# Patient Record
Sex: Female | Born: 1955 | Race: White | Hispanic: No | Marital: Married | Smoking: Never smoker
Health system: Southern US, Community
[De-identification: ages and names within clinical notes are randomized; demographics above are authoritative.]

## PROBLEM LIST (undated history)

## (undated) DIAGNOSIS — E288 Other ovarian dysfunction: Secondary | ICD-10-CM

## (undated) DIAGNOSIS — M810 Age-related osteoporosis without current pathological fracture: Secondary | ICD-10-CM

## (undated) DIAGNOSIS — D6851 Activated protein C resistance: Secondary | ICD-10-CM

## (undated) DIAGNOSIS — R112 Nausea with vomiting, unspecified: Secondary | ICD-10-CM

## (undated) DIAGNOSIS — E042 Nontoxic multinodular goiter: Secondary | ICD-10-CM

## (undated) DIAGNOSIS — Z9889 Other specified postprocedural states: Secondary | ICD-10-CM

## (undated) DIAGNOSIS — M8580 Other specified disorders of bone density and structure, unspecified site: Secondary | ICD-10-CM

## (undated) DIAGNOSIS — T7840XA Allergy, unspecified, initial encounter: Secondary | ICD-10-CM

## (undated) DIAGNOSIS — D6859 Other primary thrombophilia: Secondary | ICD-10-CM

## (undated) DIAGNOSIS — R87619 Unspecified abnormal cytological findings in specimens from cervix uteri: Secondary | ICD-10-CM

## (undated) DIAGNOSIS — D689 Coagulation defect, unspecified: Secondary | ICD-10-CM

## (undated) DIAGNOSIS — E079 Disorder of thyroid, unspecified: Secondary | ICD-10-CM

## (undated) HISTORY — PX: TONSILECTOMY, ADENOIDECTOMY, BILATERAL MYRINGOTOMY AND TUBES: SHX2538

## (undated) HISTORY — DX: Coagulation defect, unspecified: D68.9

## (undated) HISTORY — DX: Unspecified abnormal cytological findings in specimens from cervix uteri: R87.619

## (undated) HISTORY — DX: Other specified disorders of bone density and structure, unspecified site: M85.80

## (undated) HISTORY — DX: Nontoxic multinodular goiter: E04.2

## (undated) HISTORY — DX: Age-related osteoporosis without current pathological fracture: M81.0

## (undated) HISTORY — DX: Other ovarian dysfunction: E28.8

## (undated) HISTORY — PX: BREAST CYST ASPIRATION: SHX578

## (undated) HISTORY — DX: Allergy, unspecified, initial encounter: T78.40XA

## (undated) HISTORY — DX: Activated protein C resistance: D68.51

## (undated) HISTORY — DX: Disorder of thyroid, unspecified: E07.9

## (undated) HISTORY — DX: Other primary thrombophilia: D68.59

---

## 1998-05-26 ENCOUNTER — Ambulatory Visit (HOSPITAL_COMMUNITY): Admission: RE | Admit: 1998-05-26 | Discharge: 1998-05-26 | Payer: Self-pay | Admitting: Gynecology

## 1999-05-13 ENCOUNTER — Inpatient Hospital Stay (HOSPITAL_COMMUNITY): Admission: AD | Admit: 1999-05-13 | Discharge: 1999-05-15 | Payer: Self-pay | Admitting: Obstetrics & Gynecology

## 1999-06-18 ENCOUNTER — Other Ambulatory Visit: Admission: RE | Admit: 1999-06-18 | Discharge: 1999-06-18 | Payer: Self-pay | Admitting: Obstetrics and Gynecology

## 1999-12-28 ENCOUNTER — Encounter: Payer: Self-pay | Admitting: Gynecology

## 1999-12-28 ENCOUNTER — Encounter: Admission: RE | Admit: 1999-12-28 | Discharge: 1999-12-28 | Payer: Self-pay | Admitting: Gynecology

## 2007-12-12 DIAGNOSIS — M8580 Other specified disorders of bone density and structure, unspecified site: Secondary | ICD-10-CM

## 2007-12-12 HISTORY — DX: Other specified disorders of bone density and structure, unspecified site: M85.80

## 2010-11-27 ENCOUNTER — Other Ambulatory Visit: Payer: Self-pay | Admitting: Nurse Practitioner

## 2010-11-27 DIAGNOSIS — Z1231 Encounter for screening mammogram for malignant neoplasm of breast: Secondary | ICD-10-CM

## 2010-12-05 ENCOUNTER — Ambulatory Visit (HOSPITAL_COMMUNITY)
Admission: RE | Admit: 2010-12-05 | Discharge: 2010-12-05 | Disposition: A | Discharge status: Home / Self Care | Payer: BC Managed Care – PPO | Source: Ambulatory Visit | Attending: Nurse Practitioner | Admitting: Nurse Practitioner

## 2010-12-05 DIAGNOSIS — Z1231 Encounter for screening mammogram for malignant neoplasm of breast: Secondary | ICD-10-CM

## 2010-12-07 ENCOUNTER — Other Ambulatory Visit: Payer: Self-pay | Admitting: Nurse Practitioner

## 2010-12-07 ENCOUNTER — Other Ambulatory Visit: Payer: Self-pay | Admitting: Obstetrics and Gynecology

## 2010-12-07 DIAGNOSIS — R928 Other abnormal and inconclusive findings on diagnostic imaging of breast: Secondary | ICD-10-CM

## 2010-12-17 ENCOUNTER — Ambulatory Visit
Admission: RE | Admit: 2010-12-17 | Discharge: 2010-12-17 | Disposition: A | Discharge status: Home / Self Care | Payer: BC Managed Care – PPO | Source: Ambulatory Visit | Attending: Obstetrics and Gynecology | Admitting: Obstetrics and Gynecology

## 2010-12-17 DIAGNOSIS — R928 Other abnormal and inconclusive findings on diagnostic imaging of breast: Secondary | ICD-10-CM

## 2011-12-02 ENCOUNTER — Other Ambulatory Visit: Payer: Self-pay | Admitting: Obstetrics & Gynecology

## 2011-12-02 DIAGNOSIS — Z1231 Encounter for screening mammogram for malignant neoplasm of breast: Secondary | ICD-10-CM

## 2011-12-05 LAB — HM PAP SMEAR: HM Pap smear: NEGATIVE

## 2011-12-18 ENCOUNTER — Ambulatory Visit (HOSPITAL_COMMUNITY)
Admission: RE | Admit: 2011-12-18 | Discharge: 2011-12-18 | Disposition: A | Discharge status: Home / Self Care | Payer: BC Managed Care – PPO | Source: Ambulatory Visit | Attending: Obstetrics & Gynecology | Admitting: Obstetrics & Gynecology

## 2011-12-18 DIAGNOSIS — Z1231 Encounter for screening mammogram for malignant neoplasm of breast: Secondary | ICD-10-CM | POA: Insufficient documentation

## 2011-12-23 ENCOUNTER — Other Ambulatory Visit: Payer: Self-pay | Admitting: Obstetrics & Gynecology

## 2011-12-23 DIAGNOSIS — R928 Other abnormal and inconclusive findings on diagnostic imaging of breast: Secondary | ICD-10-CM

## 2011-12-30 ENCOUNTER — Ambulatory Visit
Admission: RE | Admit: 2011-12-30 | Discharge: 2011-12-30 | Disposition: A | Discharge status: Home / Self Care | Payer: BC Managed Care – PPO | Source: Ambulatory Visit | Attending: Obstetrics & Gynecology | Admitting: Obstetrics & Gynecology

## 2011-12-30 DIAGNOSIS — R928 Other abnormal and inconclusive findings on diagnostic imaging of breast: Secondary | ICD-10-CM

## 2012-10-11 HISTORY — PX: SKIN CANCER EXCISION: SHX779

## 2012-12-04 ENCOUNTER — Encounter: Payer: Self-pay | Admitting: *Deleted

## 2012-12-07 ENCOUNTER — Other Ambulatory Visit: Payer: Self-pay | Admitting: Nurse Practitioner

## 2012-12-07 DIAGNOSIS — Z1231 Encounter for screening mammogram for malignant neoplasm of breast: Secondary | ICD-10-CM

## 2012-12-08 ENCOUNTER — Ambulatory Visit (INDEPENDENT_AMBULATORY_CARE_PROVIDER_SITE_OTHER): Payer: BC Managed Care – PPO | Admitting: Nurse Practitioner

## 2012-12-08 ENCOUNTER — Encounter: Payer: Self-pay | Admitting: Nurse Practitioner

## 2012-12-08 VITALS — BP 118/66 | HR 72 | Resp 12 | Ht 68.0 in | Wt 138.8 lb

## 2012-12-08 DIAGNOSIS — Z0000 Encounter for general adult medical examination without abnormal findings: Secondary | ICD-10-CM

## 2012-12-08 DIAGNOSIS — Z01419 Encounter for gynecological examination (general) (routine) without abnormal findings: Secondary | ICD-10-CM

## 2012-12-08 DIAGNOSIS — E039 Hypothyroidism, unspecified: Secondary | ICD-10-CM

## 2012-12-08 DIAGNOSIS — M8580 Other specified disorders of bone density and structure, unspecified site: Secondary | ICD-10-CM

## 2012-12-08 DIAGNOSIS — M899 Disorder of bone, unspecified: Secondary | ICD-10-CM

## 2012-12-08 LAB — HEMOGLOBIN, FINGERSTICK: Hemoglobin, fingerstick: 14 g/dL (ref 12.0–16.0)

## 2012-12-08 LAB — POCT URINALYSIS DIPSTICK
Spec Grav, UA: 1.015
Urobilinogen, UA: NEGATIVE

## 2012-12-08 LAB — TSH: TSH: 0.542 u[IU]/mL (ref 0.350–4.500)

## 2012-12-08 MED ORDER — LEVOTHYROXINE SODIUM 75 MCG PO TABS: 75.0000 ug | ORAL_TABLET | Freq: Every day | ORAL | Status: DC

## 2012-12-08 NOTE — Patient Instructions (Addendum)

## 2012-12-08 NOTE — Progress Notes (Signed)
57 y.o. [id number] Married Caucasian Fe here for annual exam.  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 Still some vaso symptoms.  No vaginal dryness.  No LMP recorded. Patient is postmenopausal.          Sexually active: yes  The current method of family planning is post menopausal status.    Exercising: yes  run/walk Smoker:  no  Health Maintenance: Pap:  12/05/2011  Normal with negative HR HPV MMG:  12/30/2011 breast cyst repeat diagnostic ws normal Colonoscopy:  2010 normal repeat in 10 years BMD:   12/2007  -0.5/-1.8/-1.5 TDaP:  11/30/2010 Labs: Hgb-    reports that she has never smoked. She has never used smokeless tobacco. She reports that  drinks alcohol. She reports that she does not use illicit drugs.  Past Medical History  Diagnosis Date  . Factor V Leiden   . Thyroid disease     hypothyroid  . Multiple thyroid nodules   . Osteoporosis   . Protein S deficiency     Past Surgical History  Procedure Laterality Date  . Tonsilectomy, adenoidectomy, bilateral myringotomy and tubes      at age 55   . Vaginal delivery      x2    Current Outpatient Prescriptions  Medication Sig Dispense Refill  . cholecalciferol (VITAMIN D) 1000 UNITS tablet Take 1,000 Units by mouth daily.      Marland Kitchen glucosamine-chondroitin 500-400 MG tablet Take 1 tablet by mouth 3 (three) times daily.      Marland Kitchen levothyroxine (SYNTHROID, LEVOTHROID) 75 MCG tablet Take 75 mcg by mouth daily before breakfast.      . Multiple Vitamin (MULTIVITAMIN) tablet Take 1 tablet by mouth daily.       No current facility-administered medications for this visit.    History reviewed. No pertinent family history.  ROS:  Pertinent items are noted in HPI.  Otherwise, a comprehensive ROS was negative.  Exam:   BP 118/66  Pulse 72  Resp 12  Ht 5\' 8"  (1.727 m)  Wt 138 lb 12.8 oz (62.959 kg)  BMI 21.11 kg/m2 Height: 5\' 8"  (172.7 cm)  Ht Readings from Last 3 Encounters:  12/08/12 5\' 8"  (1.727 m)     General appearance: alert, cooperative and appears stated age Head: Normocephalic, without obvious abnormality, atraumatic Neck: no adenopathy, supple, symmetrical, trachea midline and thyroid without asymmetry  normal to inspection and palpation Lungs: clear to auscultation bilaterally Breasts: normal appearance, no masses or tenderness Heart: regular rate and rhythm Abdomen: soft, non-tender; no masses,  no organomegaly Extremities: extremities normal, atraumatic, no cyanosis or edema Skin: Skin color, texture, turgor normal. No rashes or lesions Lymph nodes: Cervical, supraclavicular, and axillary nodes normal. No abnormal inguinal nodes palpated Neurologic: Grossly normal   Pelvic: External genitalia:  no lesions              Urethra:  normal appearing urethra with no masses, tenderness or lesions              Bartholin's and Skene's: normal                 Vagina: normal appearing vagina with normal color and discharge, no lesions              Cervix: anteverted              Pap taken: no Bimanual Exam:  Uterus:  normal size, contour, position, consistency, mobility, non-tender  Adnexa: no mass, fullness, tenderness               Rectovaginal: Confirms               Anus:  normal sphincter tone, no lesions  A:  Well Woman with normal exam  Postmenopausal  Hypothyroid with thyroid nodules  history factor V Leiden  Osteopenia  P:   Pap smear as per guidelines   Mammogram due 12/2012 recommend 3D  [facility]  Counseled on diet exercise, SBE return annually or prn  An After Visit Summary was printed and given to the patient.

## 2012-12-09 NOTE — Progress Notes (Signed)
Encounter reviewed by Dr. Emmali Karow Silva.  

## 2012-12-10 ENCOUNTER — Telehealth: Payer: Self-pay | Admitting: *Deleted

## 2012-12-10 NOTE — Telephone Encounter (Signed)
Message copied by Osie Bond on Thu Dec 10, 2012  9:25 AM ------      Message from: Ria Comment R      Created: Tue Dec 08, 2012  6:50 PM       Let patient know lab results. Same dose of synthroid ------

## 2012-12-10 NOTE — Telephone Encounter (Signed)
Pt is aware of TSH lab results and is agreeable to stay on same dosage of Synthroid.

## 2013-01-05 ENCOUNTER — Other Ambulatory Visit: Payer: Self-pay | Admitting: Nurse Practitioner

## 2013-01-05 ENCOUNTER — Ambulatory Visit (HOSPITAL_COMMUNITY)
Admission: RE | Admit: 2013-01-05 | Discharge: 2013-01-05 | Disposition: A | Discharge status: Home / Self Care | Payer: BC Managed Care – PPO | Source: Ambulatory Visit | Attending: Nurse Practitioner | Admitting: Nurse Practitioner

## 2013-01-05 DIAGNOSIS — M899 Disorder of bone, unspecified: Secondary | ICD-10-CM

## 2013-01-05 DIAGNOSIS — Z1231 Encounter for screening mammogram for malignant neoplasm of breast: Secondary | ICD-10-CM | POA: Insufficient documentation

## 2013-01-27 ENCOUNTER — Ambulatory Visit (HOSPITAL_COMMUNITY)
Admission: RE | Admit: 2013-01-27 | Discharge: 2013-01-27 | Disposition: A | Discharge status: Home / Self Care | Payer: BC Managed Care – PPO | Source: Ambulatory Visit | Attending: Nurse Practitioner | Admitting: Nurse Practitioner

## 2013-01-27 DIAGNOSIS — Z780 Asymptomatic menopausal state: Secondary | ICD-10-CM | POA: Insufficient documentation

## 2013-01-27 DIAGNOSIS — Z13820 Encounter for screening for osteoporosis: Secondary | ICD-10-CM | POA: Insufficient documentation

## 2013-01-27 DIAGNOSIS — M8580 Other specified disorders of bone density and structure, unspecified site: Secondary | ICD-10-CM

## 2013-10-11 HISTORY — PX: REFRACTIVE SURGERY: SHX103

## 2013-12-10 ENCOUNTER — Ambulatory Visit (INDEPENDENT_AMBULATORY_CARE_PROVIDER_SITE_OTHER): Payer: BC Managed Care – PPO | Admitting: Nurse Practitioner

## 2013-12-10 ENCOUNTER — Encounter: Payer: Self-pay | Admitting: Nurse Practitioner

## 2013-12-10 VITALS — BP 110/70 | HR 80 | Ht 66.0 in | Wt 136.0 lb

## 2013-12-10 DIAGNOSIS — E039 Hypothyroidism, unspecified: Secondary | ICD-10-CM

## 2013-12-10 DIAGNOSIS — D6859 Other primary thrombophilia: Secondary | ICD-10-CM

## 2013-12-10 DIAGNOSIS — Z1211 Encounter for screening for malignant neoplasm of colon: Secondary | ICD-10-CM

## 2013-12-10 DIAGNOSIS — D6851 Activated protein C resistance: Secondary | ICD-10-CM

## 2013-12-10 DIAGNOSIS — Z0000 Encounter for general adult medical examination without abnormal findings: Secondary | ICD-10-CM

## 2013-12-10 DIAGNOSIS — Z01419 Encounter for gynecological examination (general) (routine) without abnormal findings: Secondary | ICD-10-CM

## 2013-12-10 LAB — COMPREHENSIVE METABOLIC PANEL
ALBUMIN: 4.6 g/dL (ref 3.5–5.2)
ALT: 14 U/L (ref 0–35)
AST: 16 U/L (ref 0–37)
Alkaline Phosphatase: 66 U/L (ref 39–117)
BUN: 17 mg/dL (ref 6–23)
CALCIUM: 9.9 mg/dL (ref 8.4–10.5)
CHLORIDE: 103 meq/L (ref 96–112)
CO2: 31 meq/L (ref 19–32)
CREATININE: 0.69 mg/dL (ref 0.50–1.10)
GLUCOSE: 84 mg/dL (ref 70–99)
POTASSIUM: 4.7 meq/L (ref 3.5–5.3)
Sodium: 140 mEq/L (ref 135–145)
Total Bilirubin: 0.4 mg/dL (ref 0.2–1.2)
Total Protein: 7.2 g/dL (ref 6.0–8.3)

## 2013-12-10 LAB — HEMOGLOBIN, FINGERSTICK: HEMOGLOBIN, FINGERSTICK: 14 g/dL (ref 12.0–16.0)

## 2013-12-10 LAB — CBC WITH DIFFERENTIAL/PLATELET
BASOS ABS: 0 10*3/uL (ref 0.0–0.1)
BASOS PCT: 0 % (ref 0–1)
Eosinophils Absolute: 0.2 10*3/uL (ref 0.0–0.7)
Eosinophils Relative: 3 % (ref 0–5)
HEMATOCRIT: 41.7 % (ref 36.0–46.0)
HEMOGLOBIN: 14.2 g/dL (ref 12.0–15.0)
LYMPHS PCT: 43 % (ref 12–46)
Lymphs Abs: 2.4 10*3/uL (ref 0.7–4.0)
MCH: 28.3 pg (ref 26.0–34.0)
MCHC: 34.1 g/dL (ref 30.0–36.0)
MCV: 83.1 fL (ref 78.0–100.0)
MONOS PCT: 7 % (ref 3–12)
Monocytes Absolute: 0.4 10*3/uL (ref 0.1–1.0)
NEUTROS ABS: 2.6 10*3/uL (ref 1.7–7.7)
NEUTROS PCT: 47 % (ref 43–77)
Platelets: 221 10*3/uL (ref 150–400)
RBC: 5.02 MIL/uL (ref 3.87–5.11)
RDW: 14.4 % (ref 11.5–15.5)
WBC: 5.5 10*3/uL (ref 4.0–10.5)

## 2013-12-10 LAB — LIPID PANEL
CHOL/HDL RATIO: 2 ratio
CHOLESTEROL: 157 mg/dL (ref 0–200)
HDL: 79 mg/dL (ref >39)
LDL Cholesterol: 67 mg/dL (ref 0–99)
TRIGLYCERIDES: 55 mg/dL (ref <150)
VLDL: 11 mg/dL (ref 0–40)

## 2013-12-10 LAB — POCT URINALYSIS DIPSTICK
BILIRUBIN UA: NEGATIVE
Blood, UA: NEGATIVE
GLUCOSE UA: NEGATIVE
KETONES UA: NEGATIVE
LEUKOCYTES UA: NEGATIVE
NITRITE UA: NEGATIVE
PH UA: 5
Protein, UA: NEGATIVE
Urobilinogen, UA: NEGATIVE

## 2013-12-10 NOTE — Progress Notes (Signed)
Patient ID: Brandy Ortiz, female   DOB: 06/18/1955, 58 y.o.   MRN: [id number] 58 y.o. [id number] Married Caucasian Fe here for annual exam.  No new health problems.  Some vaginal dryness and using OTC lubrication.  Patient's last menstrual period was 10/12/2003.          Sexually active: yes  The current method of family planning is post menopausal status.  Exercising: yes run/walk  Smoker: no   Health Maintenance:  Pap: 12/05/2011 Normal with negative HR HPV  MMG: 01/05/13, Bi-Rads 1: negative  Colonoscopy: 2010 normal repeat in 10 years  BMD:  01/27/13,  12/2007 -0.5/-1.8/-1.5  TDaP: 11/30/2010  Labs:  HB:  14.0   Urine:  Negative    reports that she has never smoked. She has never used smokeless tobacco. She reports that she drinks alcohol. She reports that she does not use illicit drugs.  Past Medical History  Diagnosis Date  . Factor V Leiden   . Thyroid disease     hypothyroid  . Multiple thyroid nodules   . Osteoporosis   . Protein S deficiency   . Osteopenia 12/2007  . 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 Tonsilectomy, adenoidectomy, bilateral myringotomy and tubes      at age 89   . Vaginal delivery      x2  . 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 Refractive surgery Right 10/2013    Lasix    Current Outpatient Prescriptions  Medication Sig Dispense Refill  . cholecalciferol (VITAMIN D) 1000 UNITS tablet Take 1,000 Units by mouth daily.      Marland Kitchen levothyroxine (SYNTHROID, LEVOTHROID) 75 MCG tablet Take 1 tablet (75 mcg total) by mouth daily before breakfast.  90 tablet  3  . Multiple Vitamin (MULTIVITAMIN) tablet Take 1 tablet by mouth daily.      Marland Kitchen glucosamine-chondroitin 500-400 MG tablet Take 1 tablet by mouth 3 (three) times daily.       No current facility-administered medications for this visit.    Family History  Problem Relation Age of Onset  . Adopted: Yes  . Heart disease  Father   . Stroke Father     ROS:  Pertinent items are noted in HPI.  Otherwise, a comprehensive ROS was negative.  Exam:   BP 110/70  Pulse 80  Ht 5\' 6"  (1.676 m)  Wt 136 lb (61.689 kg)  BMI 21.96 kg/m2  LMP 10/12/2003 Height: 5\' 6"  (167.6 cm)  Ht Readings from Last 3 Encounters:  12/10/13 5\' 6"  (1.676 m)  12/08/12 5\' 8"  (1.727 m)    General appearance: alert, cooperative and appears stated age Head: Normocephalic, without obvious abnormality, atraumatic Neck: no adenopathy, supple, symmetrical, trachea midline and thyroid with small nodules that are stable to inspection and palpation Lungs: clear to auscultation bilaterally Breasts: normal appearance, no masses or tenderness, FCB changes Heart: regular rate and rhythm Abdomen: soft, non-tender; no masses,  no organomegaly Extremities: extremities normal, atraumatic, no cyanosis or edema Skin: Skin color, texture, turgor normal. No rashes or lesions Lymph nodes: Cervical, supraclavicular, and axillary nodes normal. No abnormal inguinal nodes palpated Neurologic: Grossly normal   Pelvic: External genitalia:  no lesions              Urethra:  normal appearing urethra with no masses, tenderness or lesions              Bartholin's and  Skene's: normal                 Vagina: normal appearing vagina with normal color and discharge, no lesions              Cervix: anteverted              Pap taken: No. Bimanual Exam:  Uterus:  normal size, contour, position, consistency, mobility, non-tender              Adnexa: no mass, fullness, tenderness               Rectovaginal: Confirms               Anus:  normal sphincter tone, no lesions  A:  Well Woman with normal exam  History of POF  History of hypothyroid with nodules on replacement  History of Factor V Leiden,  Protein S deficiency    P:   Reviewed health and wellness pertinent to exam  Pap smear not taken today  Mammogram is due now and will schedule 3D  Refill Synthroid  and will follow with labs  IFOB is given  Counseled on breast self exam, mammography screening, adequate intake of calcium and vitamin D, diet and exercise, Kegel's exercises return annually or prn  An After Visit Summary was printed and given to the patient.

## 2013-12-10 NOTE — Patient Instructions (Signed)

## 2013-12-11 LAB — TSH: TSH: 0.811 u[IU]/mL (ref 0.350–4.500)

## 2013-12-11 LAB — VITAMIN D 25 HYDROXY (VIT D DEFICIENCY, FRACTURES): Vit D, 25-Hydroxy: 67 ng/mL (ref 30–89)

## 2013-12-19 NOTE — Progress Notes (Signed)
Encounter reviewed by Dr. Forrest Demuro Silva.  

## 2013-12-23 ENCOUNTER — Other Ambulatory Visit: Payer: Self-pay | Admitting: Nurse Practitioner

## 2013-12-23 DIAGNOSIS — Z1231 Encounter for screening mammogram for malignant neoplasm of breast: Secondary | ICD-10-CM

## 2013-12-24 LAB — FECAL OCCULT BLOOD, IMMUNOCHEMICAL: IMMUNOLOGICAL FECAL OCCULT BLOOD TEST: NEGATIVE

## 2013-12-24 NOTE — Addendum Note (Signed)
Addended by: Graylon Good on: 12/24/2013 09:49 AM   Modules accepted: Orders

## 2014-01-06 ENCOUNTER — Ambulatory Visit (HOSPITAL_COMMUNITY)
Admission: RE | Admit: 2014-01-06 | Discharge: 2014-01-06 | Disposition: A | Discharge status: Home / Self Care | Payer: BC Managed Care – PPO | Source: Ambulatory Visit | Attending: Nurse Practitioner | Admitting: Nurse Practitioner

## 2014-01-06 DIAGNOSIS — Z1231 Encounter for screening mammogram for malignant neoplasm of breast: Secondary | ICD-10-CM | POA: Diagnosis not present

## 2014-01-16 ENCOUNTER — Other Ambulatory Visit: Payer: Self-pay | Admitting: Nurse Practitioner

## 2014-01-18 NOTE — Telephone Encounter (Signed)
Last AEX: 12/10/13 Last refill:12/08/12 #90 X 3 Current AEX: 12/12/14 Last TSH: 0.811  Please advise

## 2014-03-14 ENCOUNTER — Encounter: Payer: Self-pay | Admitting: Nurse Practitioner

## 2014-12-12 ENCOUNTER — Telehealth: Payer: Self-pay | Admitting: Nurse Practitioner

## 2014-12-12 ENCOUNTER — Ambulatory Visit (INDEPENDENT_AMBULATORY_CARE_PROVIDER_SITE_OTHER): Payer: BC Managed Care – PPO | Admitting: Nurse Practitioner

## 2014-12-12 ENCOUNTER — Encounter: Payer: Self-pay | Admitting: Nurse Practitioner

## 2014-12-12 VITALS — BP 114/70 | HR 68 | Ht 65.0 in | Wt 136.0 lb

## 2014-12-12 DIAGNOSIS — Z0000 Encounter for general adult medical examination without abnormal findings: Secondary | ICD-10-CM

## 2014-12-12 DIAGNOSIS — Z01419 Encounter for gynecological examination (general) (routine) without abnormal findings: Secondary | ICD-10-CM

## 2014-12-12 DIAGNOSIS — Z1211 Encounter for screening for malignant neoplasm of colon: Secondary | ICD-10-CM | POA: Diagnosis not present

## 2014-12-12 LAB — POCT URINALYSIS DIPSTICK
BILIRUBIN UA: NEGATIVE
Blood, UA: NEGATIVE
GLUCOSE UA: NEGATIVE
KETONES UA: NEGATIVE
Leukocytes, UA: NEGATIVE
Nitrite, UA: NEGATIVE
Protein, UA: NEGATIVE
Urobilinogen, UA: NEGATIVE
pH, UA: 5

## 2014-12-12 MED ORDER — LEVOTHYROXINE SODIUM 75 MCG PO TABS: ORAL_TABLET | ORAL | Status: DC

## 2014-12-12 NOTE — Patient Instructions (Signed)

## 2014-12-12 NOTE — Progress Notes (Signed)
Patient ID: Brandy Ortiz, female   DOB: 05-Jan-1956, 59 y.o.   MRN: [id number] 59 y.o. [id number] Married  Caucasian Fe here for annual exam.  Doing well.  Some vaginal dryness.  Mother age 92 will be moving to [facility] in August.  daughter is going to college at [employer] - first year. Last thyroid ultrasound in [place] 4+ years ago.  Patient's last menstrual period was 10/12/2003 (approximate).          Sexually active: Yes.    The current method of family planning is post menopausal status.    Exercising: Yes.    walking and stationary bike Smoker:  no  Health Maintenance: Pap: 12/05/2011 Normal with negative HR HPV  MMG: 01/06/14, 3D, Bi-Rads 1: Negative  Colonoscopy: 2010 normal repeat in 10 years  BMD: 01/27/13, T Score -0.2 S/-1.3 R Total/-1.3 L Total TDaP: 11/30/2010  Labs:  HB:  14.1    Urine:  neg    reports that she has never smoked. She has never used smokeless tobacco. She reports that she drinks alcohol. She reports that she does not use illicit drugs.  Past Medical History  Diagnosis Date  . Factor V Leiden   . Thyroid disease     hypothyroid  . Multiple thyroid nodules   . Osteoporosis   . Protein S deficiency   . Osteopenia 12/2007  . 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 Tonsilectomy, adenoidectomy, bilateral myringotomy and tubes      at age 49   . Vaginal delivery      x2  . 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 Refractive surgery Right 10/2013    Lasix    Current Outpatient Prescriptions  Medication Sig Dispense Refill  . cholecalciferol (VITAMIN D) 1000 UNITS tablet Take 1,000 Units by mouth daily.    Marland Kitchen glucosamine-chondroitin 500-400 MG tablet Take 1 tablet by mouth 3 (three) times daily.    Marland Kitchen levothyroxine (SYNTHROID) 75 MCG tablet TAKE 1 TABLET IN THE MORNING BEFORE BREAKFAST. 90 tablet 3  . Multiple Vitamin (MULTIVITAMIN) tablet Take 1 tablet by mouth daily.      No current facility-administered medications for this visit.    Family History  Problem Relation Age of Onset  . Adopted: Yes  . Heart disease Father   . Stroke Father     ROS:  Pertinent items are noted in HPI.  Otherwise, a comprehensive ROS was negative.  Exam:   BP 114/70 mmHg  Pulse 68  Ht 5\' 5"  (1.651 m)  Wt 136 lb (61.689 kg)  BMI 22.63 kg/m2  LMP 10/12/2003 (Approximate) Height: 5\' 5"  (165.1 cm) Ht Readings from Last 3 Encounters:  12/12/14 5\' 5"  (1.651 m)  12/10/13 5\' 6"  (1.676 m)  12/08/12 5\' 8"  (1.727 m)    General appearance: alert, cooperative and appears stated age Head: Normocephalic, without obvious abnormality, atraumatic Neck: no adenopathy, supple, symmetrical, trachea midline and thyroid normal with small nodules to inspection and palpation Lungs: clear to auscultation bilaterally Breasts: normal appearance, no masses or tenderness Heart: regular rate and rhythm Abdomen: soft, non-tender; no masses,  no organomegaly Extremities: extremities normal, atraumatic, no cyanosis or edema Skin: Skin color, texture, turgor normal. No rashes or lesions Lymph nodes: Cervical, supraclavicular, and axillary nodes normal. No abnormal inguinal nodes palpated Neurologic: Grossly normal   Pelvic: External genitalia:  no lesions  Urethra:  normal appearing urethra with no masses, tenderness or lesions              Bartholin's and Skene's: normal                 Vagina: normal appearing vagina with normal color and discharge, no lesions              Cervix: anteverted              Pap taken: No. Bimanual Exam:  Uterus:  normal size, contour, position, consistency, mobility, non-tender              Adnexa: no mass, fullness, tenderness               Rectovaginal: Confirms               Anus:  normal sphincter tone, no lesions  Chaperone present:  yes  A:  Well Woman with normal exam  History of POF History of hypothyroid with nodules  on replacement History of Factor V Leiden, Protein S deficiency    P:   Reviewed health and wellness pertinent to exam  Pap smear as above  Mammogram is due 12/2014  Refill on Synthroid for a year  Will check on next thyroid US if needed  Counseled on breast self exam, mammography screening, adequate intake of calcium and vitamin D, diet and exercise return annually or prn  An After Visit Summary was printed and given to the patient.

## 2014-12-12 NOTE — Telephone Encounter (Addendum)
I called pt. To let her know that I failed to give her the IFOB kit - I have asked that she stop back by and pick this up.  She came by on Tuesday and picked up IFOB and she is charged.

## 2014-12-13 LAB — COMPREHENSIVE METABOLIC PANEL
ALBUMIN: 4.4 g/dL (ref 3.6–5.1)
ALK PHOS: 62 U/L (ref 33–130)
ALT: 15 U/L (ref 6–29)
AST: 17 U/L (ref 10–35)
BILIRUBIN TOTAL: 0.5 mg/dL (ref 0.2–1.2)
BUN: 14 mg/dL (ref 7–25)
CHLORIDE: 103 mmol/L (ref 98–110)
CO2: 29 mmol/L (ref 20–31)
Calcium: 9.8 mg/dL (ref 8.6–10.4)
Creat: 0.69 mg/dL (ref 0.50–1.05)
GLUCOSE: 87 mg/dL (ref 65–99)
POTASSIUM: 4.6 mmol/L (ref 3.5–5.3)
SODIUM: 142 mmol/L (ref 135–146)
TOTAL PROTEIN: 6.9 g/dL (ref 6.1–8.1)

## 2014-12-13 LAB — TSH: TSH: 0.834 u[IU]/mL (ref 0.350–4.500)

## 2014-12-14 LAB — IPS PAP TEST WITH HPV

## 2014-12-14 LAB — HEMOGLOBIN, FINGERSTICK: Hemoglobin, fingerstick: 14.1 g/dL (ref 12.0–16.0)

## 2014-12-15 ENCOUNTER — Telehealth: Payer: Self-pay | Admitting: Emergency Medicine

## 2014-12-15 DIAGNOSIS — E039 Hypothyroidism, unspecified: Secondary | ICD-10-CM

## 2014-12-15 DIAGNOSIS — E041 Nontoxic single thyroid nodule: Secondary | ICD-10-CM

## 2014-12-15 NOTE — Telephone Encounter (Signed)
-----   Message from Kem Boroughs, [place] sent at 12/14/2014  4:59 PM EDT ----- She had thyroid US originally done while living in  [place] - showing thyroid nodules.  I believe thyroid US / or maybe a biopsy was done there - can we get that information and then decide if she needs further eval. ----- Message -----    From: Nunzio Cobbs, MD    Sent: 12/13/2014  10:48 AM      To: Kem Boroughs, FNP  Hi Patty,   Let's get a copy of the thyroid ultrasound and any biopsies she may have done so we can understand better.  She may need to be monitored by endocrinology.   Thanks.  Brook   ----- Message -----    From: Kem Boroughs, FNP    Sent: 12/12/2014   3:25 PM      To: Brook Oletta Lamas, MD  This patient had thyroid US in [place] before moving here.  When or does she need a repeat thyroid US done again.  We monitor TSH and refill her Synthroid.

## 2014-12-15 NOTE — Telephone Encounter (Signed)
Patient given results from provider and she came by yesterday to pick up stool testing kit.   Patient states she was seen by [facility], Dr. Fredirick Lathe. Will request records. Faxed request to [phone] at this time.

## 2014-12-15 NOTE — Telephone Encounter (Signed)
Entered by Kem Boroughs, FNP at 12/13/2014 8:24 AM   Webb Silversmith, The labs show as normal kidney, liver, and glucose. The thyroid is normal. I failed to give you the stool test yesterday and called with a message. So sorry about this - better to stop by and pick up than to mail it.

## 2014-12-17 NOTE — Progress Notes (Signed)
Encounter reviewed by Dr. Aundria Rud. Will get records from prior thyroid ultrasound.

## 2014-12-21 ENCOUNTER — Encounter: Payer: Self-pay | Admitting: Emergency Medicine

## 2014-12-22 NOTE — Telephone Encounter (Signed)
Called patient and she is agreeable to scheduling thyroid ultrasound. She is scheduled for 12/26/14 at 1545 at [facility] at [address], [place] [address].  Instructions and information given to patient and she is agreeable. Advised will call back with results and plan of care.  Routing to provider for final review. Patient agreeable to disposition. Will close encounter.    Marland Kitchen

## 2014-12-22 NOTE — Telephone Encounter (Signed)
-----   Message from Kem Boroughs, [place] sent at 12/20/2014  9:58 AM EDT ----- Upon review of medical records from [facility], the last thyroid US was in 2010.  Report says to repeat in 2 years - which has not been done.  So she is due for a repeat thyroid US.  Please schedule with patient.  ----- Message -----    From: Michele Mcalpine, RN    Sent: 12/20/2014   8:47 AM      To: Kem Boroughs, FNP  Patty I have obtained the records and placed in your office.  ----- Message -----    From: Kem Boroughs, FNP    Sent: 12/14/2014   4:59 PM      To: Trellis Paganini Paidyn Mcferran, RN  She had thyroid US originally done while living in  [place] - showing thyroid nodules.  I believe thyroid US / or maybe a biopsy was done there - can we get that information and then decide if she needs further eval. ----- Message -----    From: Nunzio Cobbs, MD    Sent: 12/13/2014  10:48 AM      To: Kem Boroughs, FNP  Hi Patty,   Let's get a copy of the thyroid ultrasound and any biopsies she may have done so we can understand better.  She may need to be monitored by endocrinology.   Thanks.  Brook   ----- Message -----    From: Kem Boroughs, FNP    Sent: 12/12/2014   3:25 PM      To: Brook Oletta Lamas, MD  This patient had thyroid US in [place] before moving here.  When or does she need a repeat thyroid US done again.  We monitor TSH and refill her Synthroid.

## 2014-12-26 ENCOUNTER — Ambulatory Visit
Admission: RE | Admit: 2014-12-26 | Discharge: 2014-12-26 | Disposition: A | Discharge status: Home / Self Care | Payer: BC Managed Care – PPO | Source: Ambulatory Visit | Attending: Nurse Practitioner | Admitting: Nurse Practitioner

## 2014-12-26 ENCOUNTER — Telehealth: Payer: Self-pay | Admitting: Nurse Practitioner

## 2014-12-26 DIAGNOSIS — E039 Hypothyroidism, unspecified: Secondary | ICD-10-CM

## 2014-12-26 DIAGNOSIS — E041 Nontoxic single thyroid nodule: Secondary | ICD-10-CM

## 2014-12-26 NOTE — Telephone Encounter (Signed)
[facility] is requesting an [place] report on thyroid from [place] the patient brought to [place]. Routing to [place] per [place]. Please fax to number below.  Fax: [phone]

## 2014-12-27 NOTE — Telephone Encounter (Signed)
Spoke with Levada Dy. Advised faxing copy of ultrasound from [facility] from 11/09/2008. Faxed copy at this time with fax confirmation received.  Routing to provider for final review.  Will close encounter.

## 2014-12-28 ENCOUNTER — Telehealth: Payer: Self-pay | Admitting: Nurse Practitioner

## 2014-12-28 NOTE — Telephone Encounter (Signed)
Pt is notified that we are awaiting a comparison from the thyroid report in [place] to be compared with current US of the thyroid.  She is aware that this report will be sent to Dr. Quincy Simmonds and still may need a fine needle aspiration. Brandy Ortiz has been sent a note to check with Imaging facility to see if a comparison has been made.

## 2014-12-28 NOTE — Telephone Encounter (Addendum)
Michele Mcalpine, RN at 12/27/2014 9:29 AM     Spoke with Levada Dy at [facility]. Advised faxing copy of ultrasound from [facility] from 11/09/2008. Faxed copy at this time with fax confirmation received.

## 2014-12-29 NOTE — Telephone Encounter (Signed)
Called [facility] for update on addendum to report.  She requested I fax copy of prior imaging report to Dr. Jonelle Sports attention. Faxed at this time with confirmation received to [phone].

## 2015-01-02 ENCOUNTER — Telehealth: Payer: Self-pay | Admitting: Emergency Medicine

## 2015-01-02 DIAGNOSIS — E039 Hypothyroidism, unspecified: Secondary | ICD-10-CM

## 2015-01-02 DIAGNOSIS — E041 Nontoxic single thyroid nodule: Secondary | ICD-10-CM

## 2015-01-02 LAB — FECAL OCCULT BLOOD, IMMUNOCHEMICAL: IMMUNOLOGICAL FECAL OCCULT BLOOD TEST: NEGATIVE

## 2015-01-02 NOTE — Telephone Encounter (Signed)
Spoke with patient and message from Dr. Quincy Simmonds given.  Patient agreeable to fine needle aspiration and then referral to endocrinology. Patient is current patient at [facility], requests referral to Dr. Chalmers Cater. Advised will send order for referral to be pending while waiting for biopsy appointment. Patient agreeable.   Left message at [place] to schedule thyroid biopsy. Patient prefers late afternoon appointment and no Tuesdays if possible.

## 2015-01-02 NOTE — Addendum Note (Signed)
Addended by: Susanne Greenhouse E on: 01/02/2015 12:38 PM   Modules accepted: Orders

## 2015-01-02 NOTE — Telephone Encounter (Signed)
-----   Message from Nunzio Cobbs, MD sent at 12/29/2014  5:39 AM EDT ----- Findings meet criteria for fine needle aspiration. Options for the patient are the following: 1) return appointment for fine needle aspirate and then referral to endocrinology. 2) refer to endocrinology. Keep in imaging hold.  Brandy Ortiz

## 2015-01-03 NOTE — Telephone Encounter (Signed)
Patient returned call and is agreeable to appointment as scheduled. She verbalized understanding of instructions.  Advised will send referral to Dr. Chalmers Cater when results of biopsy obtained. Patient agreeable.

## 2015-01-03 NOTE — Telephone Encounter (Signed)
Routing to provider for final review. Patient agreeable to disposition. Will close encounter.   Patient aware provider will review message and nurse will return call if any additional advice or change of disposition.

## 2015-01-03 NOTE — Telephone Encounter (Signed)
Message left to return call to [place] at [phone].   Patient scheduled for thyroid biopsy at [facility] at [address], [place] [address] on 01/25/15 at 1540.

## 2015-01-17 ENCOUNTER — Other Ambulatory Visit: Payer: Self-pay | Admitting: Nurse Practitioner

## 2015-01-17 DIAGNOSIS — Z1231 Encounter for screening mammogram for malignant neoplasm of breast: Secondary | ICD-10-CM

## 2015-01-23 ENCOUNTER — Ambulatory Visit (HOSPITAL_COMMUNITY)
Admission: RE | Admit: 2015-01-23 | Discharge: 2015-01-23 | Disposition: A | Discharge status: Home / Self Care | Payer: BC Managed Care – PPO | Source: Ambulatory Visit | Attending: Nurse Practitioner | Admitting: Nurse Practitioner

## 2015-01-23 DIAGNOSIS — Z1231 Encounter for screening mammogram for malignant neoplasm of breast: Secondary | ICD-10-CM | POA: Insufficient documentation

## 2015-01-25 ENCOUNTER — Ambulatory Visit
Admission: RE | Admit: 2015-01-25 | Discharge: 2015-01-25 | Disposition: A | Discharge status: Home / Self Care | Payer: BC Managed Care – PPO | Source: Ambulatory Visit | Attending: Obstetrics and Gynecology | Admitting: Obstetrics and Gynecology

## 2015-01-25 ENCOUNTER — Other Ambulatory Visit: Payer: Self-pay | Admitting: Nurse Practitioner

## 2015-01-25 ENCOUNTER — Other Ambulatory Visit (HOSPITAL_COMMUNITY)
Admission: RE | Admit: 2015-01-25 | Discharge: 2015-01-25 | Disposition: A | Discharge status: Home / Self Care | Payer: BC Managed Care – PPO | Source: Ambulatory Visit | Attending: Obstetrics and Gynecology | Admitting: Obstetrics and Gynecology

## 2015-01-25 DIAGNOSIS — E041 Nontoxic single thyroid nodule: Secondary | ICD-10-CM | POA: Insufficient documentation

## 2015-01-25 DIAGNOSIS — R928 Other abnormal and inconclusive findings on diagnostic imaging of breast: Secondary | ICD-10-CM

## 2015-01-27 ENCOUNTER — Telehealth: Payer: Self-pay | Admitting: Emergency Medicine

## 2015-01-27 DIAGNOSIS — E039 Hypothyroidism, unspecified: Secondary | ICD-10-CM

## 2015-01-27 NOTE — Telephone Encounter (Signed)
-----   Message from Nunzio Cobbs, MD sent at 01/26/2015  4:22 PM EDT ----- Please report benign thyroid nodule to patient.  I do recommend that she be followed by endocrinology for her thyroid care.  Please make a referral to endocrinologist of her choice or we can make a suggestion.   [place]

## 2015-01-27 NOTE — Telephone Encounter (Signed)
Patient is scheduled to see Dr. Renato Shin 02/03/15 at 1530 at [facility].   [address] phone [phone]  Patient given appointment information and is agreeable.  She will follow up as scheduled.   Routing to Olivea Sonnen for final review. Patient agreeable to disposition. Will close encounter.

## 2015-01-27 NOTE — Telephone Encounter (Signed)
Call to patient. She is given message from Dr. Quincy Simmonds.  She is agreeable to referral for endocrinology.   She requests appointment late in the afternoon, after 1530 and no Tuesdays.

## 2015-02-03 ENCOUNTER — Ambulatory Visit (INDEPENDENT_AMBULATORY_CARE_PROVIDER_SITE_OTHER): Payer: BC Managed Care – PPO | Admitting: Endocrinology

## 2015-02-03 ENCOUNTER — Encounter: Payer: Self-pay | Admitting: Endocrinology

## 2015-02-03 VITALS — BP 122/82 | HR 97 | Temp 98.5°F | Ht 65.5 in | Wt 137.0 lb

## 2015-02-03 DIAGNOSIS — E042 Nontoxic multinodular goiter: Secondary | ICD-10-CM

## 2015-02-03 DIAGNOSIS — Z23 Encounter for immunization: Secondary | ICD-10-CM

## 2015-02-03 NOTE — Progress Notes (Signed)
Subjective:    Patient ID: Brandy Ortiz, female    DOB: 1956-04-24, 59 y.o.   MRN: [id number]  HPI Pt was noted to have a nodule at the thyroid in approx 1985.  she is unaware of any prior thyroid problem.  she says she had a bx then, which was benign.  she has no h/o XRT or surgery to the neck.  she reports hypothyroidism was also dx'ed in approx 1985.  she has been on prescribed thyroid hormone therapy since then. she has never had thyroid surgery, or XRT to the neck.  she has never been on amiodarone or lithium.  She has intermittent mild swelling at the anterior neck, but no assoc pain.   Past Medical History  Diagnosis Date  . Factor V Leiden   . Thyroid disease     hypothyroid  . Multiple thyroid nodules   . Osteoporosis   . Protein S deficiency   . Osteopenia 12/2007  . 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 Tonsilectomy, adenoidectomy, bilateral myringotomy and tubes      at age 20   . Vaginal delivery      x2  . 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 Refractive surgery Right 10/2013    Lasix    Social History   Social History  . Marital Status: Married    Spouse Name: N/A  . Number of Children: N/A  . Years of Education: N/A   Occupational History  . Not on file.   Social History Main Topics  . Smoking status: Never Smoker   . Smokeless tobacco: Never Used  . Alcohol Use: 0.0 oz/week    2-3 Standard drinks or equivalent per week  . Drug Use: No  . Sexual Activity: Yes    Birth Control/ Protection: Post-menopausal   Other Topics Concern  . Not on file   Social History Narrative    Current Outpatient Prescriptions on File Prior to Visit  Medication Sig Dispense Refill  . cholecalciferol (VITAMIN D) 1000 UNITS tablet Take 1,000 Units by mouth daily.    Marland Kitchen glucosamine-chondroitin 500-400 MG tablet Take 1 tablet by mouth 3 (three) times daily.    Marland Kitchen levothyroxine  (SYNTHROID) 75 MCG tablet TAKE 1 TABLET IN THE MORNING BEFORE BREAKFAST. 90 tablet 3  . Multiple Vitamin (MULTIVITAMIN) tablet Take 1 tablet by mouth daily.     No current facility-administered medications on file prior to visit.    Allergies  Allergen Reactions  . Adhesive [Tape]     From band aids  . Sulfa Antibiotics     Family History  Problem Relation Age of Onset  . Adopted: Yes  . Heart disease Father   . Stroke Father     BP 122/82 mmHg  Pulse 97  Temp(Src) 98.5 F (36.9 C) (Oral)  Ht 5' 5.5" (1.664 m)  Wt 137 lb (62.143 kg)  BMI 22.44 kg/m2  SpO2 94%  LMP 10/12/2003 (Approximate)  Review of Systems denies depression, muscle cramps, sob, constipation, numbness, blurry vision, rhinorrhea, easy bruising, and syncope.  She has mild hair loss, cold intolerance, and slight weight gain.    Objective:   Physical Exam VS: see vs page GEN: no distress HEAD: head: no deformity eyes: no periorbital swelling, no proptosis external nose and ears are normal mouth: no lesion seen NECK: I can palpate the left thyroid nodule.  CHEST WALL: no deformity  LUNGS:  Clear to auscultation CV: reg rate and rhythm, no murmur ABD: abdomen is soft, nontender.  no hepatosplenomegaly.  not distended.  no hernia MUSCULOSKELETAL: muscle bulk and strength are grossly normal.  no obvious joint swelling.  gait is normal and steady EXTEMITIES: no deformity.  no ulcer on the feet.  feet are of normal color and temp.  no edema PULSES: dorsalis pedis intact bilat.  no carotid bruit NEURO:  cn 2-12 grossly intact.   readily moves all 4's.  sensation is intact to touch on the feet SKIN:  Normal texture and temperature.  No rash or suspicious lesion is visible.   NODES:  None palpable at the neck PSYCH: alert, well-oriented.  Does not appear anxious nor depressed.   Lab Results  Component Value Date   TSH 0.834 12/12/2014   Radiol: thyroid US (12/29/14): left lower pole nodule is stable but  the left upper pole nodule has enlarged slightly.  Thyroid Bx cytology (5/95/63): BENIGN FOLLICULAR NODULE.      Assessment & Plan:  Hypothyroidism, new to me, well-replaced Multinodular goiter: this bx did not show chronic thyroiditis, so she may have 2 different thyroid problems (hyperfunctioning nodules and chronic thyroiditis).    Patient is advised the following: Patient Instructions  Please continue the same thyroid medication.   With time your medication need may go up or down (up is more likely).  Please return in 2 years.

## 2015-02-03 NOTE — Patient Instructions (Signed)
Please continue the same thyroid medication.   With time your medication need may go up or down (up is more likely).  Please return in 2 years.

## 2015-02-04 DIAGNOSIS — E042 Nontoxic multinodular goiter: Secondary | ICD-10-CM | POA: Insufficient documentation

## 2015-02-07 ENCOUNTER — Other Ambulatory Visit: Payer: BC Managed Care – PPO

## 2015-02-09 ENCOUNTER — Ambulatory Visit
Admission: RE | Admit: 2015-02-09 | Discharge: 2015-02-09 | Disposition: A | Discharge status: Home / Self Care | Payer: BC Managed Care – PPO | Source: Ambulatory Visit | Attending: Nurse Practitioner | Admitting: Nurse Practitioner

## 2015-02-09 ENCOUNTER — Other Ambulatory Visit: Payer: Self-pay | Admitting: Nurse Practitioner

## 2015-02-09 DIAGNOSIS — R928 Other abnormal and inconclusive findings on diagnostic imaging of breast: Secondary | ICD-10-CM

## 2015-02-20 ENCOUNTER — Ambulatory Visit
Admission: RE | Admit: 2015-02-20 | Discharge: 2015-02-20 | Disposition: A | Discharge status: Home / Self Care | Payer: BC Managed Care – PPO | Source: Ambulatory Visit | Attending: Nurse Practitioner | Admitting: Nurse Practitioner

## 2015-02-20 ENCOUNTER — Other Ambulatory Visit: Payer: Self-pay | Admitting: Nurse Practitioner

## 2015-02-20 DIAGNOSIS — N6001 Solitary cyst of right breast: Secondary | ICD-10-CM

## 2015-02-20 DIAGNOSIS — R928 Other abnormal and inconclusive findings on diagnostic imaging of breast: Secondary | ICD-10-CM

## 2015-06-09 ENCOUNTER — Telehealth: Payer: Self-pay | Admitting: Emergency Medicine

## 2015-06-09 NOTE — Telephone Encounter (Signed)
-----   Message from Nunzio Cobbs, MD sent at 06/08/2015  8:02 PM EST ----- Regarding: RE: [place] to remove from mammogram hold and return to routine screening.   Thanks,   Brook ----- Message -----    From: Michele Mcalpine, RN    Sent: 06/08/2015   5:19 PM      To: Brook Oletta Lamas, MD Subject: Mammogram hold                                 Dr. Quincy Simmonds,  Kem Boroughs, FNP patient. In mammogram hold for evaluation of R Breast. Ultrasound guided biopsy completed. Okay to remove from mammogram hold?

## 2015-06-09 NOTE — Telephone Encounter (Signed)
Out of hold per Dr. Silva   

## 2015-12-15 ENCOUNTER — Ambulatory Visit (INDEPENDENT_AMBULATORY_CARE_PROVIDER_SITE_OTHER): Payer: BC Managed Care – PPO | Admitting: Nurse Practitioner

## 2015-12-15 ENCOUNTER — Encounter: Payer: Self-pay | Admitting: Nurse Practitioner

## 2015-12-15 ENCOUNTER — Other Ambulatory Visit: Payer: Self-pay | Admitting: Nurse Practitioner

## 2015-12-15 VITALS — BP 108/66 | HR 80 | Ht 65.0 in | Wt 138.0 lb

## 2015-12-15 DIAGNOSIS — Z1231 Encounter for screening mammogram for malignant neoplasm of breast: Secondary | ICD-10-CM

## 2015-12-15 DIAGNOSIS — Z01419 Encounter for gynecological examination (general) (routine) without abnormal findings: Secondary | ICD-10-CM | POA: Diagnosis not present

## 2015-12-15 DIAGNOSIS — E039 Hypothyroidism, unspecified: Secondary | ICD-10-CM

## 2015-12-15 DIAGNOSIS — Z1211 Encounter for screening for malignant neoplasm of colon: Secondary | ICD-10-CM

## 2015-12-15 DIAGNOSIS — Z0000 Encounter for general adult medical examination without abnormal findings: Secondary | ICD-10-CM | POA: Diagnosis not present

## 2015-12-15 LAB — CBC WITH DIFFERENTIAL/PLATELET
BASOS ABS: 0 {cells}/uL (ref 0–200)
Basophils Relative: 0 %
EOS ABS: 166 {cells}/uL (ref 15–500)
Eosinophils Relative: 2 %
HCT: 42.7 % (ref 35.0–45.0)
Hemoglobin: 14.4 g/dL (ref 11.7–15.5)
LYMPHS PCT: 30 %
Lymphs Abs: 2490 cells/uL (ref 850–3900)
MCH: 29.2 pg (ref 27.0–33.0)
MCHC: 33.7 g/dL (ref 32.0–36.0)
MCV: 86.6 fL (ref 80.0–100.0)
MONOS PCT: 8 %
MPV: 9.8 fL (ref 7.5–12.5)
Monocytes Absolute: 664 cells/uL (ref 200–950)
NEUTROS ABS: 4980 {cells}/uL (ref 1500–7800)
NEUTROS PCT: 60 %
PLATELETS: 235 10*3/uL (ref 140–400)
RBC: 4.93 MIL/uL (ref 3.80–5.10)
RDW: 14.3 % (ref 11.0–15.0)
WBC: 8.3 10*3/uL (ref 3.8–10.8)

## 2015-12-15 LAB — POCT URINALYSIS DIPSTICK
Bilirubin, UA: NEGATIVE
Blood, UA: NEGATIVE
Glucose, UA: NEGATIVE
Ketones, UA: NEGATIVE
LEUKOCYTES UA: NEGATIVE
NITRITE UA: NEGATIVE
PROTEIN UA: NEGATIVE
UROBILINOGEN UA: NEGATIVE
pH, UA: 5

## 2015-12-15 LAB — TSH: TSH: 0.57 m[IU]/L

## 2015-12-15 MED ORDER — LEVOTHYROXINE SODIUM 75 MCG PO TABS: ORAL_TABLET | ORAL | 4 refills | Status: DC

## 2015-12-15 NOTE — Patient Instructions (Addendum)
EXERCISE AND DIET:  We recommended that you start or continue a regular exercise program for good health. Regular exercise means any activity that makes your heart beat faster and makes you sweat.  We recommend exercising at least 30 minutes per day at least 3 days a week, preferably 4 or 5.  We also recommend a diet low in fat and sugar.  Inactivity, poor dietary choices and obesity can cause diabetes, heart attack, stroke, and kidney damage, among others.    ALCOHOL AND SMOKING:  Women should limit their alcohol intake to no more than 7 drinks/beers/glasses of wine (combined, not each!) per week. Moderation of alcohol intake to this level decreases your risk of breast cancer and liver damage. And of course, no recreational drugs are part of a healthy lifestyle.  And absolutely no smoking or even second hand smoke. Most people know smoking can cause heart and lung diseases, but did you know it also contributes to weakening of your bones? Aging of your skin?  Yellowing of your teeth and nails?  CALCIUM AND VITAMIN D:  Adequate intake of calcium and Vitamin D are recommended.  The recommendations for exact amounts of these supplements seem to change often, but generally speaking 600 mg of calcium (either carbonate or citrate) and 800 units of Vitamin D per day seems prudent. Certain women may benefit from higher intake of Vitamin D.  If you are among these women, your doctor will have told you during your visit.    PAP SMEARS:  Pap smears, to check for cervical cancer or precancers,  have traditionally been done yearly, although recent scientific advances have shown that most women can have pap smears less often.  However, every woman still should have a physical exam from her gynecologist every year. It will include a breast check, inspection of the vulva and vagina to check for abnormal growths or skin changes, a visual exam of the cervix, and then an exam to evaluate the size and shape of the uterus and  ovaries.  And after 58 years of age, a rectal exam is indicated to check for rectal cancers. We will also provide age appropriate advice regarding health maintenance, like when you should have certain vaccines, screening for sexually transmitted diseases, bone density testing, colonoscopy, mammograms, etc.   MAMMOGRAMS:  All women over 40 years old should have a yearly mammogram. Many facilities now offer a "3D" mammogram, which may cost around $50 extra out of pocket. If possible,  we recommend you accept the option to have the 3D mammogram performed.  It both reduces the number of women who will be called back for extra views which then turn out to be normal, and it is better than the routine mammogram at detecting truly abnormal areas.    COLONOSCOPY:  Colonoscopy to screen for colon cancer is recommended for all women at age 50.  We know, you hate the idea of the prep.  We agree, BUT, having colon cancer and not knowing it is worse!!  Colon cancer so often starts as a polyp that can be seen and removed at colonscopy, which can quite literally save your life!  And if your first colonoscopy is normal and you have no family history of colon cancer, most women don't have to have it again for 10 years.  Once every ten years, you can do something that may end up saving your life, right?  We will be happy to help you get it scheduled when you are ready.    Be sure to check your insurance coverage so you understand how much it will cost.  It may be covered as a preventative service at no cost, but you should check your particular policy.    Atrophic Vaginitis Atrophic vaginitis is a condition in which the tissues that line the vagina become dry and thin. This condition is most common in women who have stopped having regular menstrual periods (menopause). This usually starts when a woman is 77-72 years old. Estrogen helps to keep the vagina moist. It stimulates the vagina to produce a clear fluid that lubricates  the vagina for sexual intercourse. This fluid also protects the vagina from infection. Lack of estrogen can cause the lining of the vagina to get thinner and dryer. The vagina may also shrink in size. It may become less elastic. Atrophic vaginitis tends to get worse over time as a woman's estrogen level drops. CAUSES This condition is caused by the normal drop in estrogen that happens around the time of menopause. RISK FACTORS Certain conditions or situations may lower a woman's estrogen level, which increases her risk of atrophic vaginitis. These include:  Taking medicine that blocks estrogen.  Having ovaries removed surgically.  Being treated for cancer with X-ray treatment (radiation) or medicines (chemotherapy).  Exercising very hard and often.  Having an eating disorder (anorexia).  Giving birth or breastfeeding.  Being over the age of 23.  Smoking. SYMPTOMS Symptoms of this condition include:  Pain, soreness, or bleeding during sexual intercourse (dyspareunia).  Vaginal burning, irritation, or itching.  Pain or bleeding during a vaginal examination using a speculum (pelvic exam).  Loss of interest in sexual activity.  Having burning pain when passing urine.  Vaginal discharge that is brown or yellow. In some cases, there are no symptoms. DIAGNOSIS This condition is diagnosed with a medical history and physical exam. This will include a pelvic exam that checks whether the inside of your vagina appears pale, thin, or dry. Rarely, you may also have other tests, including:  A urine test.  A test that checks the acid balance in your vaginal fluid (acid balance test). TREATMENT Treatment for this condition may depend on the severity of your symptoms. Treatment may include:  Using an over-the-counter vaginal lubricant before you have sexual intercourse.  Using a long-acting vaginal moisturizer.  Using low-dose vaginal estrogen for moderate to severe symptoms that do  not respond to other treatments. Options include creams, tablets, and inserts (vaginal rings). Before using vaginal estrogen, tell your health care provider if you have a history of:  Breast cancer.  Endometrial cancer.  Blood clots.  Taking medicines. You may be able to take a daily pill for dyspareunia. Discuss all of the risks of this medicine with your health care provider. It is usually not recommended for women who have a family history or personal history of breast cancer. If your symptoms are very mild and you are not sexually active, you may not need treatment. HOME CARE INSTRUCTIONS  Take medicines only as directed by your health care provider. Do not use herbal or alternative medicines unless your health care provider says that you can.  Use over-the-counter creams, lubricants, or moisturizers for dryness only as directed by your health care provider.  If your atrophic vaginitis is caused by menopause, discuss all of your menopausal symptoms and treatment options with your health care provider.  Do not douche.  Do not use products that can make your vagina dry. These include:  Scented feminine sprays.  Scented tampons.  Scented soaps.  If it hurts to have sex, talk with your sexual partner. SEEK MEDICAL CARE IF:  Your discharge looks different than normal.  Your vagina has an unusual smell.  You have new symptoms.  Your symptoms do not improve with treatment.  Your symptoms get worse.   This information is not intended to replace advice given to you by your health care provider. Make sure you discuss any questions you have with your health care provider.   Document Released: 09/13/2014 Document Reviewed: 09/13/2014 Elsevier Interactive Patient Education [employer].

## 2015-12-15 NOTE — Progress Notes (Signed)
Patient ID: Brandy Ortiz, female   DOB: 1955/07/06, 60 y.o.   MRN: [id number]  60 y.o. [id number] Married  Caucasian Fe here for annual exam.  She is having a problem with increase in vaginal dryness.  She is using OTC lubrication with some help.  She does have a feeling of urethral irritation after SA.  She did have a thyroid ultrasound and biopsy done last year that was benign. Endocrinologist referred her care back to [place].  Mother age 50 is in [place] and may be moving to assisted living from [facility].    Patient's last menstrual period was 10/12/2003 (approximate).          Sexually active: Yes.    The current method of family planning is post menopausal status.    Exercising: Yes.    walking Smoker:  no  Health Maintenance: Pap: 12/12/14, Negative with negative HR HPV  MMG:  01/23/15, 3D with right diagnostic and ultrasound on [id number] showing complicated cyst that was aspirated on 02/20/15.  Bilateral screening mammogram due in one year scheduled 01/24/16. Colonoscopy: 2010 normal repeat in 10 years, [facility] BMD: 01/27/13, T Score -0.2 Spine / -1.4 Right Femur Neck / -1.4 Left Femur Neck TDaP: 11/30/2010 Hep C: done today HIV: 2000 with pregnancy Labs: will do CBC with other labs, will come back fasting for lipids, CMP  Urine: negative   reports that she has never smoked. She has never used smokeless tobacco. She reports that she drinks alcohol. She reports that she does not use drugs.  Past Medical History:  Diagnosis Date  . Factor V Leiden ([place])   . Multiple thyroid nodules   . Osteopenia 12/2007  . Osteoporosis   . 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 Protein S deficiency ([place])   . Thyroid disease    hypothyroid    Past Surgical History:  Procedure Laterality Date  . REFRACTIVE SURGERY Right 10/2013   Lasix  . 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 TONSILECTOMY, ADENOIDECTOMY, BILATERAL MYRINGOTOMY AND TUBES     at age 20    . VAGINAL DELIVERY     x2    Current Outpatient Prescriptions  Medication Sig Dispense Refill  . cholecalciferol (VITAMIN D) 1000 UNITS tablet Take 1,000 Units by mouth daily.    Marland Kitchen glucosamine-chondroitin 500-400 MG tablet Take 1 tablet by mouth 3 (three) times daily.    Marland Kitchen levothyroxine (SYNTHROID) 75 MCG tablet TAKE 1 TABLET IN THE MORNING BEFORE BREAKFAST. 90 tablet 3  . Multiple Vitamin (MULTIVITAMIN) tablet Take 1 tablet by mouth daily.     No current facility-administered medications for this visit.     Family History  Problem Relation Age of Onset  . Adopted: Yes  . Heart disease Father   . Stroke Father     ROS:  Pertinent items are noted in HPI.  Otherwise, a comprehensive ROS was negative.  Exam:   LMP 10/12/2003 (Approximate)    Ht Readings from Last 3 Encounters:  02/03/15 5' 5.5" (1.664 m)  12/12/14 5\' 5"  (1.651 m)  12/10/13 5\' 6"  (1.676 m)    General appearance: alert, cooperative and appears stated age Head: Normocephalic, without obvious abnormality, atraumatic Neck: no adenopathy, supple, symmetrical, trachea midline and thyroid nodule is felt on the left is unchanged, otherwise normal to inspection and palpation Lungs: clear to auscultation bilaterally Breasts: normal appearance, no masses or tenderness Heart: regular rate and rhythm Abdomen: soft, non-tender; no masses,  no organomegaly Extremities: extremities normal, atraumatic, no cyanosis or edema Skin: Skin color, texture, turgor normal. No rashes or lesions Lymph nodes: Cervical, supraclavicular, and axillary nodes normal. No abnormal inguinal nodes palpated Neurologic: Grossly normal   Pelvic: External genitalia:  no lesions              Urethra:  Atrophic and prolapsed appearing urethra with no masses, tenderness or lesions              Bartholin's and Skene's: normal                 Vagina: very atrophic appearing vagina with normal color and discharge, no lesions              Cervix:  anteverted              Pap taken: No. Bimanual Exam:  Uterus:  normal size, contour, position, consistency, mobility, non-tender              Adnexa: no mass, fullness, tenderness               Rectovaginal: Confirms               Anus:  normal sphincter tone, no lesions  Chaperone present: yes  A:  Well Woman with normal exam             History of POF about age 23 History of hypothyroid with nodules that was benign -  on replacement History of Factor V Leiden, Protein S deficiency  Atrophic vaginitis    P:   Reviewed health and wellness pertinent to exam  Pap smear as above  Mammogram is due 01/2016  Follow with labs  Refill on Synthroid  IFOB is given  Discussed vaginal atrophy, use of OTC medications including Creme' de feme"  Counseled on breast self exam, mammography screening, adequate intake of calcium and vitamin D, diet and exercise, Kegel's exercises return annually or prn  An After Visit Summary was printed and given to the patient.

## 2015-12-16 LAB — HEPATITIS C ANTIBODY: HCV AB: NEGATIVE

## 2015-12-16 LAB — VITAMIN D 25 HYDROXY (VIT D DEFICIENCY, FRACTURES): VIT D 25 HYDROXY: 37 ng/mL (ref 30–100)

## 2015-12-19 ENCOUNTER — Other Ambulatory Visit (INDEPENDENT_AMBULATORY_CARE_PROVIDER_SITE_OTHER): Payer: BC Managed Care – PPO

## 2015-12-19 DIAGNOSIS — Z0000 Encounter for general adult medical examination without abnormal findings: Secondary | ICD-10-CM

## 2015-12-19 LAB — LIPID PANEL
Cholesterol: 169 mg/dL (ref 125–200)
HDL: 87 mg/dL (ref >=46)
LDL CALC: 66 mg/dL (ref <130)
TRIGLYCERIDES: 82 mg/dL (ref <150)
Total CHOL/HDL Ratio: 1.9 Ratio (ref <=5.0)
VLDL: 16 mg/dL (ref <30)

## 2015-12-19 LAB — COMPREHENSIVE METABOLIC PANEL
ALK PHOS: 60 U/L (ref 33–130)
ALT: 13 U/L (ref 6–29)
AST: 15 U/L (ref 10–35)
Albumin: 4.3 g/dL (ref 3.6–5.1)
BUN: 18 mg/dL (ref 7–25)
CO2: 25 mmol/L (ref 20–31)
CREATININE: 0.74 mg/dL (ref 0.50–1.05)
Calcium: 9.2 mg/dL (ref 8.6–10.4)
Chloride: 105 mmol/L (ref 98–110)
Glucose, Bld: 95 mg/dL (ref 65–99)
POTASSIUM: 4.8 mmol/L (ref 3.5–5.3)
Sodium: 139 mmol/L (ref 135–146)
TOTAL PROTEIN: 6.6 g/dL (ref 6.1–8.1)
Total Bilirubin: 0.3 mg/dL (ref 0.2–1.2)

## 2015-12-20 NOTE — Progress Notes (Signed)
Encounter reviewed. TSH just done and normal.  Sumner Boast, MD

## 2015-12-20 NOTE — Addendum Note (Signed)
Addended by: Kem Boroughs R on: 12/20/2015 10:43 AM   Modules accepted: Orders

## 2015-12-25 LAB — FECAL OCCULT BLOOD, IMMUNOCHEMICAL: IMMUNOLOGICAL FECAL OCCULT BLOOD TEST: NEGATIVE

## 2015-12-25 NOTE — Addendum Note (Signed)
Addended by: Remigio Eisenmenger on: 12/25/2015 08:14 AM   Modules accepted: Orders

## 2016-01-24 ENCOUNTER — Ambulatory Visit: Payer: BC Managed Care – PPO

## 2016-01-24 ENCOUNTER — Ambulatory Visit
Admission: RE | Admit: 2016-01-24 | Discharge: 2016-01-24 | Disposition: A | Discharge status: Home / Self Care | Payer: BC Managed Care – PPO | Source: Ambulatory Visit | Attending: Nurse Practitioner | Admitting: Nurse Practitioner

## 2016-01-24 DIAGNOSIS — Z1231 Encounter for screening mammogram for malignant neoplasm of breast: Secondary | ICD-10-CM

## 2016-06-21 DIAGNOSIS — H9313 Tinnitus, bilateral: Secondary | ICD-10-CM | POA: Insufficient documentation

## 2016-06-21 DIAGNOSIS — H6983 Other specified disorders of Eustachian tube, bilateral: Secondary | ICD-10-CM | POA: Insufficient documentation

## 2016-06-21 DIAGNOSIS — J310 Chronic rhinitis: Secondary | ICD-10-CM | POA: Insufficient documentation

## 2016-12-16 ENCOUNTER — Ambulatory Visit: Payer: BC Managed Care – PPO | Admitting: Nurse Practitioner

## 2017-01-02 ENCOUNTER — Other Ambulatory Visit: Payer: Self-pay | Admitting: Certified Nurse Midwife

## 2017-01-02 ENCOUNTER — Encounter: Payer: Self-pay | Admitting: Certified Nurse Midwife

## 2017-01-02 ENCOUNTER — Ambulatory Visit (INDEPENDENT_AMBULATORY_CARE_PROVIDER_SITE_OTHER): Payer: BC Managed Care – PPO | Admitting: Certified Nurse Midwife

## 2017-01-02 VITALS — BP 120/70 | HR 70 | Resp 16 | Ht 64.75 in | Wt 139.0 lb

## 2017-01-02 DIAGNOSIS — Z0000 Encounter for general adult medical examination without abnormal findings: Secondary | ICD-10-CM | POA: Diagnosis not present

## 2017-01-02 DIAGNOSIS — Z01419 Encounter for gynecological examination (general) (routine) without abnormal findings: Secondary | ICD-10-CM | POA: Diagnosis not present

## 2017-01-02 DIAGNOSIS — Z1231 Encounter for screening mammogram for malignant neoplasm of breast: Secondary | ICD-10-CM

## 2017-01-02 DIAGNOSIS — E039 Hypothyroidism, unspecified: Secondary | ICD-10-CM

## 2017-01-02 NOTE — Progress Notes (Signed)
61 y.o. [id number] Married  Caucasian Fe here for annual exam. Menopausal no HRT, denies vaginal bleeding. Some vaginal dryness uses OTC products with good results, periodic. Sees Endocrine yearly as needed. Has labs here. Sees [facility] only prn  Still working on settling mother's affairs after death. No other health concerns today. 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    Patient's last menstrual period was 10/12/2003 (approximate).          Sexually active: Yes.    The current method of family planning is post menopausal status.    Exercising: Yes.    walking Smoker:  no  Health Maintenance: Pap:  12-12-14 neg HPV HR neg History of Abnormal Pap: over 83yrs ago, just repeat done MMG:  01-24-16 category b density birads 1:neg Self Breast exams: occ Colonoscopy:  2010 normal f/u 23yrs BMD:   2014 negative TDaP:  2012 Shingles: no Pneumonia: no Hep C and HIV: HIV neg 2000, Hep c neg 2017 Labs: none   reports that she has never smoked. She has never used smokeless tobacco. She reports that she drinks about 1.8 - 2.4 oz of alcohol per week . She reports that she does not use drugs.  Past Medical History:  Diagnosis Date  . Factor V Leiden ([place])   . Multiple thyroid nodules   . Osteopenia 12/2007  . Osteoporosis   . 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 Protein S deficiency ([place])   . Thyroid disease    hypothyroid    Past Surgical History:  Procedure Laterality Date  . REFRACTIVE SURGERY Right 10/2013   Lasix  . 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 TONSILECTOMY, ADENOIDECTOMY, BILATERAL MYRINGOTOMY AND TUBES     at age 7   . VAGINAL DELIVERY     x2    Current Outpatient Prescriptions  Medication Sig Dispense Refill  . cholecalciferol (VITAMIN D) 1000 UNITS tablet Take 1,000 Units by mouth daily.    Marland Kitchen glucosamine-chondroitin 500-400 MG tablet Take 1 tablet by mouth 3 (three) times daily.    Marland Kitchen levothyroxine (SYNTHROID) 75  MCG tablet TAKE 1 TABLET IN THE MORNING BEFORE BREAKFAST. 90 tablet 4  . Multiple Vitamin (MULTIVITAMIN) tablet Take 1 tablet by mouth daily.     No current facility-administered medications for this visit.     Family History  Problem Relation Age of Onset  . Adopted: Yes  . Heart disease Father   . Stroke Father     ROS:  Pertinent items are noted in HPI.  Otherwise, a comprehensive ROS was negative.  Exam:   BP 120/70   Pulse 70   Resp 16   Ht 5' 4.75" (1.645 m)   Wt 139 lb (63 kg)   LMP 10/12/2003 (Approximate)   BMI 23.31 kg/m  Height: 5' 4.75" (164.5 cm) Ht Readings from Last 3 Encounters:  01/02/17 5' 4.75" (1.645 m)  12/15/15 5\' 5"  (1.651 m)  02/03/15 5' 5.5" (1.664 m)    General appearance: alert, cooperative and appears stated age Head: Normocephalic, without obvious abnormality, atraumatic Neck: no adenopathy, supple, symmetrical, trachea midline and thyroid normal to inspection and palpation Lungs: clear to auscultation bilaterally Breasts: normal appearance, no masses or tenderness, No nipple retraction or dimpling, No nipple discharge or bleeding, No axillary or supraclavicular adenopathy Heart: regular rate and rhythm Abdomen: soft, non-tender; no masses,  no organomegaly Extremities: extremities normal, atraumatic, no cyanosis or edema Skin: Skin color, texture, turgor  normal. No rashes or lesions Lymph nodes: Cervical, supraclavicular, and axillary nodes normal. No abnormal inguinal nodes palpated Neurologic: Grossly normal   Pelvic: External genitalia:  no lesions              Urethra:  normal appearing urethra with no masses, tenderness or lesions              Bartholin's and Skene's: normal                 Vagina: normal appearing vagina with normal color and discharge, no lesions              Cervix: no cervical motion tenderness and no lesions              Pap taken: No. Bimanual Exam:  Uterus:  normal size, contour, position, consistency,  mobility, non-tender              Adnexa: normal adnexa and no mass, fullness, tenderness               Rectovaginal: Confirms               Anus:  normal sphincter tone, no lesions  Chaperone present: yes  A:  Well Woman with normal exam  Menopausal no HRT  Atrophic vaginitis   Hypothyroid stable medication , history of benign nodule  With Endocrine management  Bone density due  Screening labs  P:   Reviewed health and wellness pertinent to exam  Aware of need to evaluate if vaginal bleeding  Discussed finding and need for more regular treatment with coconut oil, patient will work on  Continue follow up as indicated  Patient will schedule BMD with mammogram  Labs: TSH, Lipid panel, Vitamin D  Pap smear: no  counseled on breast self exam, mammography screening, feminine hygiene, adequate intake of calcium and vitamin D, diet and exercise return annually or prn  An After Visit Summary was printed and given to the patient.

## 2017-01-02 NOTE — Patient Instructions (Signed)

## 2017-01-03 LAB — COMPREHENSIVE METABOLIC PANEL
ALT: 15 IU/L (ref 0–32)
AST: 16 IU/L (ref 0–40)
Albumin/Globulin Ratio: 1.8 (ref 1.2–2.2)
Albumin: 4.7 g/dL (ref 3.5–5.5)
Alkaline Phosphatase: 69 IU/L (ref 39–117)
BUN/Creatinine Ratio: 22 (ref 9–23)
BUN: 14 mg/dL (ref 6–24)
Bilirubin Total: 0.3 mg/dL (ref 0.0–1.2)
CALCIUM: 9.7 mg/dL (ref 8.7–10.2)
CO2: 26 mmol/L (ref 20–29)
Chloride: 103 mmol/L (ref 96–106)
Creatinine, Ser: 0.65 mg/dL (ref 0.57–1.00)
GFR calc Af Amer: 113 mL/min/{1.73_m2} (ref >59)
GFR calc non Af Amer: 98 mL/min/{1.73_m2} (ref >59)
GLOBULIN, TOTAL: 2.6 g/dL (ref 1.5–4.5)
Glucose: 88 mg/dL (ref 65–99)
Potassium: 4.7 mmol/L (ref 3.5–5.2)
SODIUM: 145 mmol/L — AB (ref 134–144)
Total Protein: 7.3 g/dL (ref 6.0–8.5)

## 2017-01-03 LAB — VITAMIN D 25 HYDROXY (VIT D DEFICIENCY, FRACTURES): Vit D, 25-Hydroxy: 48.1 ng/mL (ref 30.0–100.0)

## 2017-01-03 LAB — TSH: TSH: 2.34 u[IU]/mL (ref 0.450–4.500)

## 2017-01-05 ENCOUNTER — Other Ambulatory Visit: Payer: Self-pay | Admitting: Certified Nurse Midwife

## 2017-01-05 DIAGNOSIS — E039 Hypothyroidism, unspecified: Secondary | ICD-10-CM

## 2017-01-05 MED ORDER — LEVOTHYROXINE SODIUM 75 MCG PO TABS: ORAL_TABLET | ORAL | 3 refills | Status: DC

## 2017-02-03 ENCOUNTER — Ambulatory Visit
Admission: RE | Admit: 2017-02-03 | Discharge: 2017-02-03 | Disposition: A | Discharge status: Home / Self Care | Payer: BC Managed Care – PPO | Source: Ambulatory Visit | Attending: Certified Nurse Midwife | Admitting: Certified Nurse Midwife

## 2017-02-03 ENCOUNTER — Telehealth: Payer: Self-pay | Admitting: Certified Nurse Midwife

## 2017-02-03 DIAGNOSIS — Z8739 Personal history of other diseases of the musculoskeletal system and connective tissue: Secondary | ICD-10-CM

## 2017-02-03 DIAGNOSIS — Z1231 Encounter for screening mammogram for malignant neoplasm of breast: Secondary | ICD-10-CM

## 2017-02-03 NOTE — Telephone Encounter (Signed)
Order for BMD placed through [facility]. Patient has been notified and will call to schedule an appointment.  Routing to provider for final review. Patient agreeable to disposition. Will close encounter.

## 2017-02-03 NOTE — Telephone Encounter (Signed)
Patient need an order for a bone density scan fax to the [facility].

## 2017-02-05 ENCOUNTER — Other Ambulatory Visit: Payer: Self-pay | Admitting: Certified Nurse Midwife

## 2017-02-05 DIAGNOSIS — R928 Other abnormal and inconclusive findings on diagnostic imaging of breast: Secondary | ICD-10-CM

## 2017-02-10 ENCOUNTER — Ambulatory Visit
Admission: RE | Admit: 2017-02-10 | Discharge: 2017-02-10 | Disposition: A | Discharge status: Home / Self Care | Payer: BC Managed Care – PPO | Source: Ambulatory Visit | Attending: Certified Nurse Midwife | Admitting: Certified Nurse Midwife

## 2017-02-10 ENCOUNTER — Other Ambulatory Visit: Payer: Self-pay | Admitting: Certified Nurse Midwife

## 2017-02-10 DIAGNOSIS — R928 Other abnormal and inconclusive findings on diagnostic imaging of breast: Secondary | ICD-10-CM

## 2017-02-10 DIAGNOSIS — N63 Unspecified lump in unspecified breast: Secondary | ICD-10-CM

## 2017-04-15 IMAGING — US US SOFT TISSUE HEAD/NECK
1 series · 13 of 25 positions shown · non-contrast
Comparison: Outside ultrasound from [HOSPITAL] 1252 is not
available. The report is also not available.

ADDENDUM:
The report from a prior study dated 11/09/2008 is now available.
Images are not available. On that report, 2 nodules are described in
the left lobe. The superior nodule measures 1.6 x 1.1 x 1.1 cm
corresponding to a 1.3 x 1.0 x 2.0 cm nodule on the present study.
Also described is a lower pole nodule measuring 10 x 9 x 9 mm
measuring 11 x 9 x 9 mm on the present study. Based on this
assessment, the left lower pole nodule is stable but the left upper
pole nodule has enlarged slightly an should be considered to for
fine-needle aspiration biopsy.
CLINICAL DATA: Hypothyroidism. Follow-up nodules. Currently on
Synthroid.

EXAM:
THYROID ULTRASOUND
TECHNIQUE: Ultrasound examination of the thyroid gland and adjacent soft
tissues was performed.

[Series 1: us soft tissue head/neck · 0.06mm/px · 13 of 47 slices shown]
[im 1/47]
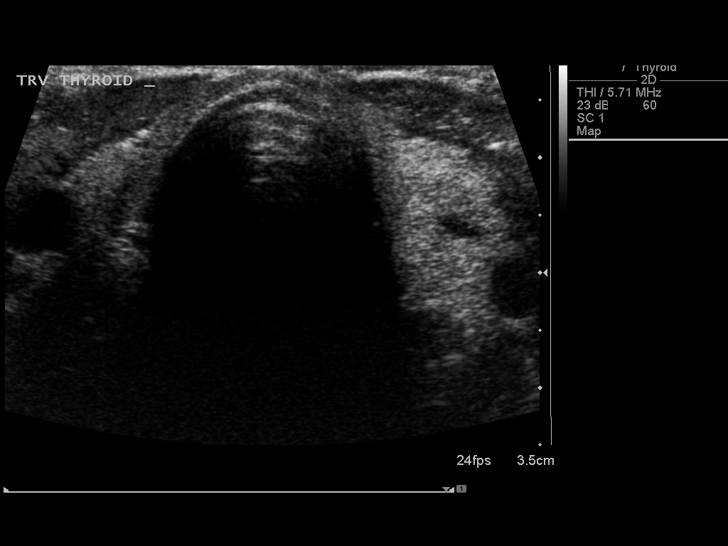
[im 4/47]
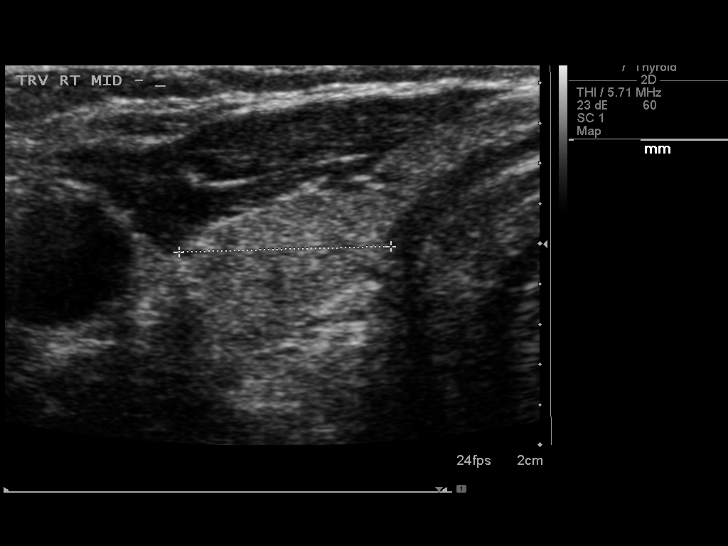
[im 8/47]
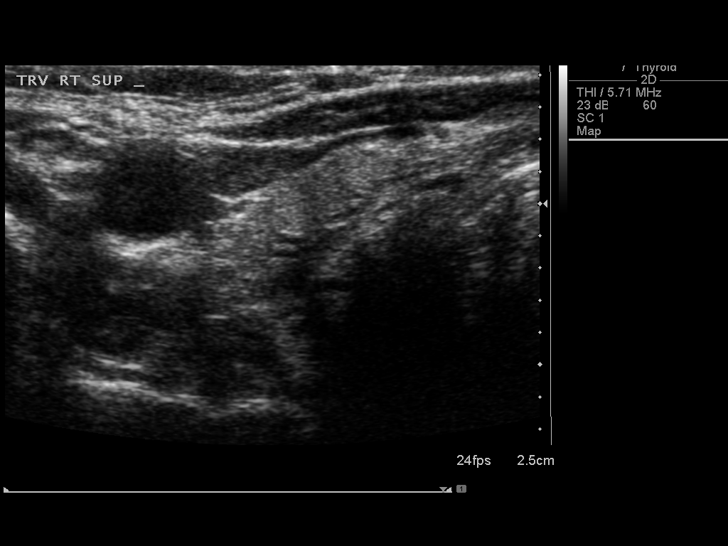
[im 12/47]
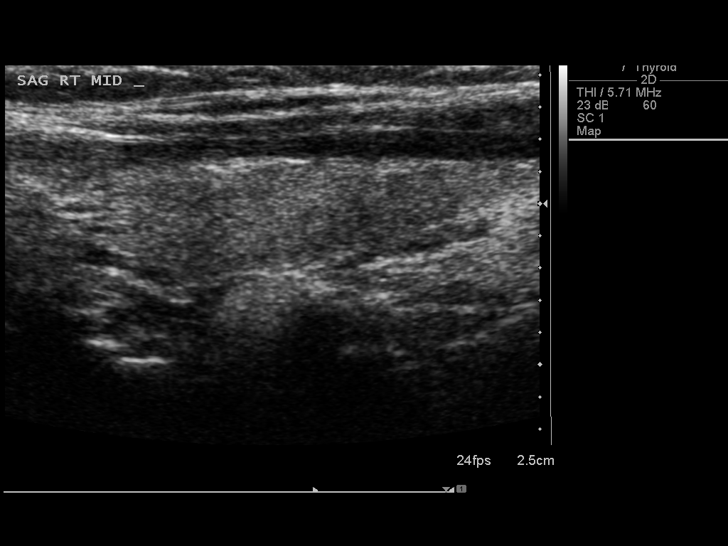
[im 16/47]
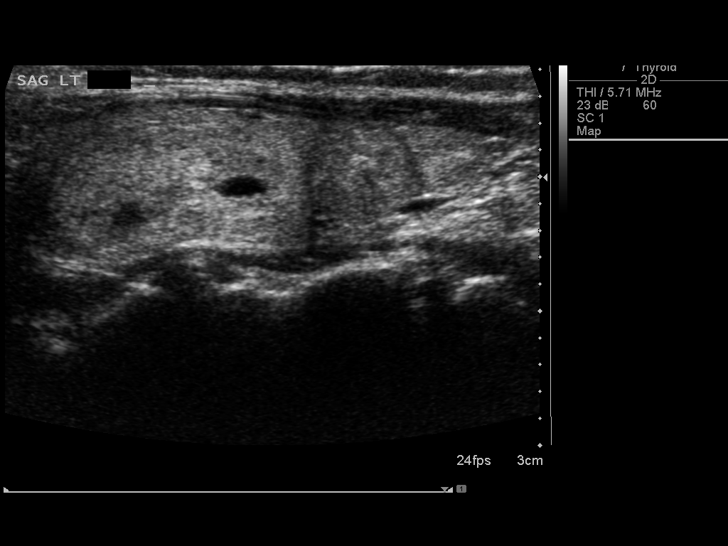
[im 20/47]
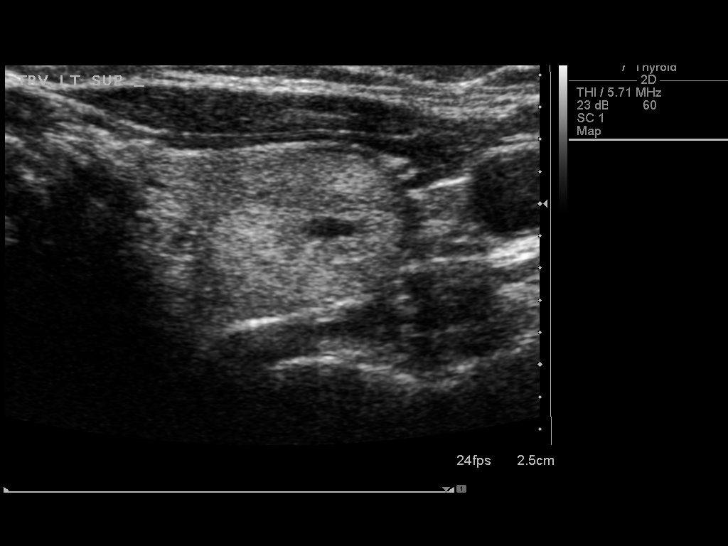
[im 24/47]
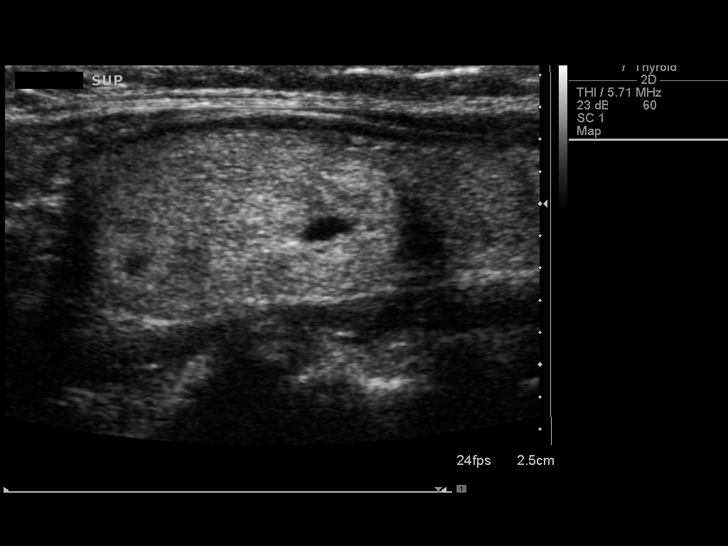
[im 27/47]
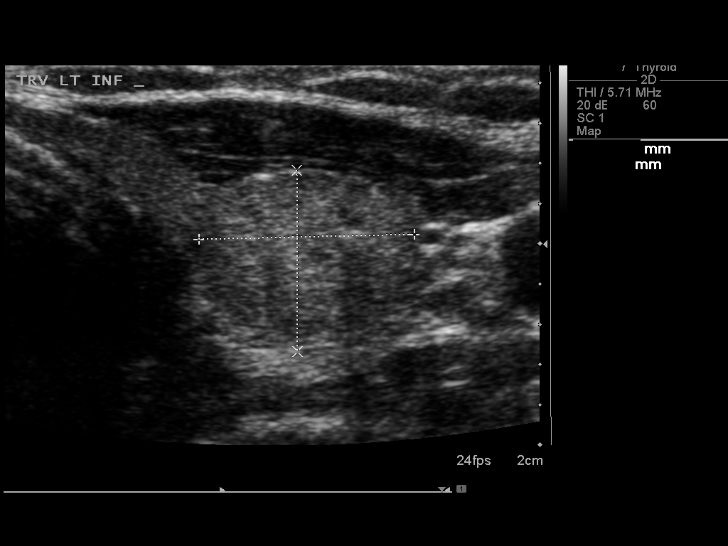
[im 31/47]
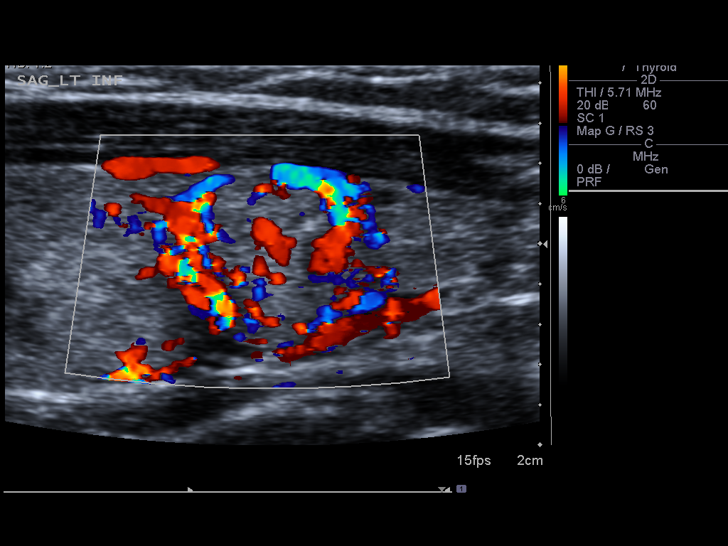
[im 35/47]
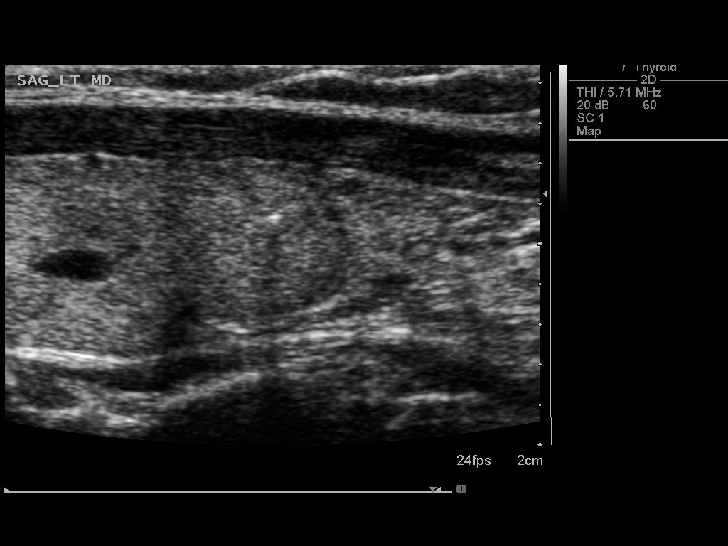
[im 39/47]
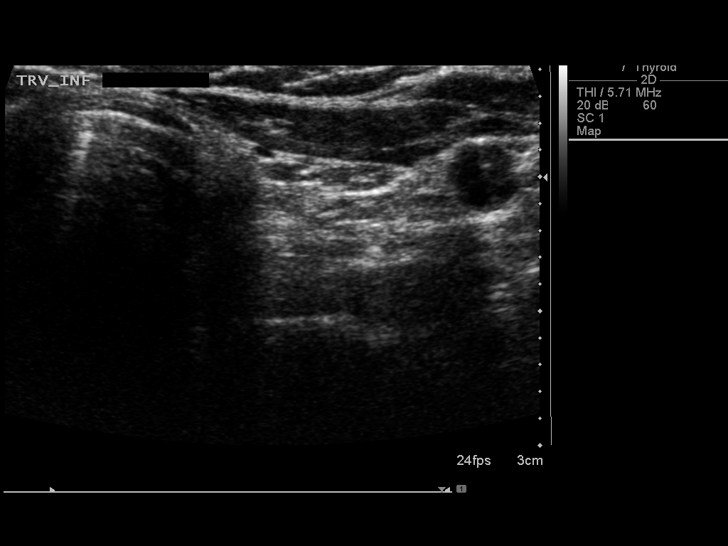
[im 43/47]
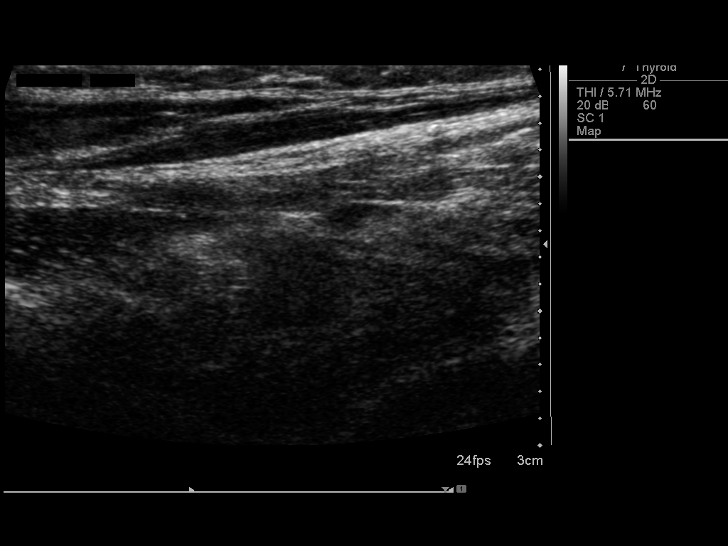
[im 47/47]
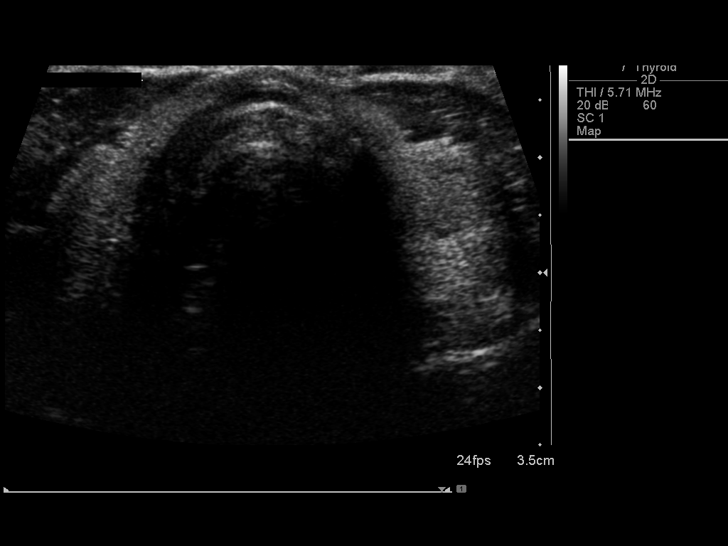

[13 of 25 positions shown; findings below may reference images not displayed]

FINDINGS: Right thyroid lobe

Measurements: 3.0 x 0.7 x 1.1 cm. Mildly heterogeneous lobe without
focal nodule.

Left thyroid lobe

Measurements: 3.6 x 1.1 x 1.6 cm. Solid upper pole nodule measures
1.3 x 1.0 x 2.0 cm. There is some minimal cystic area and moderately
prominent internal vascularity. Solid lower pole nodule measures
x 0.9 x 0.9 cm. There is markedly increased vascularity centrally
and some calcification.

Isthmus

Thickness: 1 mm.  No nodules visualized.

Lymphadenopathy

None visualized.
IMPRESSION: Two left-sided nodules as described. Both of these meet criteria for
fine needle aspiration biopsy. A comparison with the prior study, if
available, would be helpful. If these nodules are stable, stability
with support benign etiology. If the prior study is not available,
fine-needle aspiration biopsy can be performed. Findings meet
consensus criteria for biopsy. Ultrasound-guided fine needle
aspiration should be considered, as per the consensus statement:
Management of Thyroid Nodules Detected at US: Society of
Radiologists in Ultrasound Consensus Conference Statement. Radiology

## 2017-08-14 ENCOUNTER — Ambulatory Visit
Admission: RE | Admit: 2017-08-14 | Discharge: 2017-08-14 | Disposition: A | Discharge status: Home / Self Care | Payer: BC Managed Care – PPO | Source: Ambulatory Visit | Attending: Certified Nurse Midwife | Admitting: Certified Nurse Midwife

## 2017-08-14 DIAGNOSIS — N63 Unspecified lump in unspecified breast: Secondary | ICD-10-CM

## 2017-08-14 DIAGNOSIS — Z8739 Personal history of other diseases of the musculoskeletal system and connective tissue: Secondary | ICD-10-CM

## 2017-08-19 ENCOUNTER — Telehealth: Payer: Self-pay

## 2017-08-19 NOTE — Telephone Encounter (Signed)
lmtcb

## 2017-08-19 NOTE — Telephone Encounter (Signed)
Left message to check mychart bmd result.

## 2017-08-19 NOTE — Telephone Encounter (Signed)
-----   Message from Regina Eck, CNM sent at 08/19/2017  7:48 AM EDT ----- Romilda Garret, Your Bone density is showing osteopenia only in left femur. Your overall fracture risk is low so no medication recommended at this time. Be sure to have 4 servings of calcium in diet daily and Vitamin D. Continue your Vitamin D supplement daily. Weight bearing exercises are recommended 3-4 times weekly (walking) Spine is normal limits Repeat BMD in 2 years. Have a good day Debbi

## 2018-01-06 ENCOUNTER — Ambulatory Visit: Payer: BC Managed Care – PPO | Admitting: Certified Nurse Midwife

## 2018-01-24 ENCOUNTER — Other Ambulatory Visit: Payer: Self-pay | Admitting: Certified Nurse Midwife

## 2018-01-24 DIAGNOSIS — E039 Hypothyroidism, unspecified: Secondary | ICD-10-CM

## 2018-01-26 NOTE — Telephone Encounter (Signed)
Medication refill request: synthroid 18mcg   Last AEX:  01/02/17 Next AEX:  06/26/18 Last MMG (if hormonal medication request): 08/14/17 Category 2 benign  Refill authorized: Please advise

## 2018-01-26 NOTE — Telephone Encounter (Signed)
She will need to have lab for TSH prior to refill to make sure level is maintaining.

## 2018-01-28 ENCOUNTER — Other Ambulatory Visit: Payer: Self-pay | Admitting: Certified Nurse Midwife

## 2018-01-28 DIAGNOSIS — E039 Hypothyroidism, unspecified: Secondary | ICD-10-CM

## 2018-01-28 MED ORDER — LEVOTHYROXINE SODIUM 75 MCG PO TABS
ORAL_TABLET | ORAL | 0 refills | Status: DC
Start: 2018-01-28 — End: 2018-02-05

## 2018-01-28 NOTE — Telephone Encounter (Signed)
Medication refill request: Synthroid Last AEX:  01/02/2017 Next AEX: 02/04/2018 Last MMG (if hormonal medication request): 08/14/2017 BI-RADS CATEGORY  2: Benign. Refill authorized: #30, 0 refills

## 2018-01-28 NOTE — Telephone Encounter (Signed)
Patient requesting refill for synthroid until she can come in for her aex on 9/25. [employer]

## 2018-01-28 NOTE — Addendum Note (Signed)
Addended by: Amado Coe on: 01/28/2018 12:46 PM   Modules accepted: Orders

## 2018-02-04 ENCOUNTER — Other Ambulatory Visit (HOSPITAL_COMMUNITY)
Admission: RE | Admit: 2018-02-04 | Discharge: 2018-02-04 | Disposition: A | Discharge status: Home / Self Care | Payer: BC Managed Care – PPO | Source: Ambulatory Visit | Attending: Obstetrics & Gynecology | Admitting: Obstetrics & Gynecology

## 2018-02-04 ENCOUNTER — Ambulatory Visit: Payer: BC Managed Care – PPO | Admitting: Certified Nurse Midwife

## 2018-02-04 ENCOUNTER — Encounter: Payer: Self-pay | Admitting: Certified Nurse Midwife

## 2018-02-04 VITALS — BP 94/60 | HR 68 | Resp 16 | Ht 64.75 in | Wt 135.0 lb

## 2018-02-04 DIAGNOSIS — Z01411 Encounter for gynecological examination (general) (routine) with abnormal findings: Secondary | ICD-10-CM

## 2018-02-04 DIAGNOSIS — Z124 Encounter for screening for malignant neoplasm of cervix: Secondary | ICD-10-CM | POA: Diagnosis present

## 2018-02-04 DIAGNOSIS — Z0000 Encounter for general adult medical examination without abnormal findings: Secondary | ICD-10-CM | POA: Diagnosis not present

## 2018-02-04 DIAGNOSIS — E039 Hypothyroidism, unspecified: Secondary | ICD-10-CM

## 2018-02-04 DIAGNOSIS — N951 Menopausal and female climacteric states: Secondary | ICD-10-CM

## 2018-02-04 DIAGNOSIS — N8110 Cystocele, unspecified: Secondary | ICD-10-CM

## 2018-02-04 LAB — POCT URINALYSIS DIPSTICK
Bilirubin, UA: NEGATIVE
GLUCOSE UA: NEGATIVE
Ketones, UA: NEGATIVE
LEUKOCYTES UA: NEGATIVE
Nitrite, UA: NEGATIVE
Protein, UA: NEGATIVE
RBC UA: NEGATIVE
Urobilinogen, UA: NEGATIVE E.U./dL — AB
pH, UA: 5 (ref 5.0–8.0)

## 2018-02-04 NOTE — Progress Notes (Signed)
62 y.o. [id number] Married  Caucasian Fe here for annual exam. Menopausal no HRT. Denies vaginal and dryness and bleeding. Exercising more with walking and has 4 pound weight loss. Synthroid working well, no thyroid symptom changes. Colonoscopy due this year, not sure she wants to do again. Had episode of urinary frequency but no pain, fever or chills. Has resolved now. No other health issues today.  Patient's last menstrual period was 10/12/2003 (approximate).          Sexually active: Yes.    The current method of family planning is post menopausal status.    Exercising: Yes.    walking Smoker:  no  Review of Systems  Constitutional: Negative.   HENT: Positive for hearing loss.   Eyes: Negative.   Respiratory: Negative.   Cardiovascular: Negative.   Gastrointestinal: Negative.   Genitourinary: Positive for frequency.  Musculoskeletal: Negative.   Skin: Negative.   Neurological: Negative.   Endo/Heme/Allergies: Negative.   Psychiatric/Behavioral: Negative.     Health Maintenance: Pap:  12-12-14 neg HPV HR neg History of Abnormal Pap: abnormal yrs ago but just repeat done MMG: 9/18 bilateral & f/u left breast u/s 10/18, 4/19 neg Self Breast exams: occ Colonoscopy:  2010 normal f/u 63yrs BMD:   2019 normal TDaP:  2012 Shingles: not done Pneumonia: not done Hep C and HIV: HIV neg 2000, Hep c neg 2017 Labs: poct urine-neg   reports that she has never smoked. She has never used smokeless tobacco. She reports that she drinks about 4.0 standard drinks of alcohol per week. She reports that she does not use drugs.  Past Medical History:  Diagnosis Date  . Abnormal Pap smear of cervix    62 yrs old, just repeat done  . Factor V Leiden ([place])   . Multiple thyroid nodules   . Osteopenia 12/2007  . Osteoporosis   . 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 Protein S deficiency ([place])   . Thyroid disease    hypothyroid    Past Surgical History:  Procedure Laterality Date  . BREAST CYST  ASPIRATION Right   . REFRACTIVE SURGERY Right 10/2013   Lasix  . 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 TONSILECTOMY, ADENOIDECTOMY, BILATERAL MYRINGOTOMY AND TUBES     at age 21   . VAGINAL DELIVERY     x2    Current Outpatient Medications  Medication Sig Dispense Refill  . cholecalciferol (VITAMIN D) 1000 UNITS tablet Take 1,000 Units by mouth daily.    Marland Kitchen levothyroxine (SYNTHROID) 75 MCG tablet TAKE 1 TABLET IN THE MORNING BEFORE BREAKFAST. 30 tablet 0  . Multiple Vitamin (MULTIVITAMIN) tablet Take 1 tablet by mouth daily.    Marland Kitchen glucosamine-chondroitin 500-400 MG tablet Take 1 tablet by mouth 3 (three) times daily.     No current facility-administered medications for this visit.     Family History  Adopted: Yes  Problem Relation Age of Onset  . Heart disease Father   . Stroke Father     ROS:  Pertinent items are noted in HPI.  Otherwise, a comprehensive ROS was negative.  Exam:   BP 94/60   Pulse 68   Resp 16   Ht 5' 4.75" (1.645 m)   Wt 135 lb (61.2 kg)   LMP 10/12/2003 (Approximate)   BMI 22.64 kg/m  Height: 5' 4.75" (164.5 cm) Ht Readings from Last 3 Encounters:  02/04/18 5' 4.75" (1.645 m)  01/02/17 5' 4.75" (1.645 m)  12/15/15 5'  5" (1.651 m)    General appearance: alert, cooperative and appears stated age Head: Normocephalic, without obvious abnormality, atraumatic Neck: no adenopathy, supple, symmetrical, trachea midline and thyroid normal to inspection and palpation Lungs: clear to auscultation bilaterally Breasts: normal appearance, no masses or tenderness, No nipple retraction or dimpling, No nipple discharge or bleeding, No axillary or supraclavicular adenopathy Heart: regular rate and rhythm Abdomen: soft, non-tender; no masses,  no organomegaly Extremities: extremities normal, atraumatic, no cyanosis or edema Skin: Skin color, texture, turgor normal. No rashes or lesions Lymph nodes: Cervical, supraclavicular, and axillary  nodes normal. No abnormal inguinal nodes palpated Neurologic: Grossly normal   Pelvic: External genitalia:  no lesions              Urethra:  normal appearing urethra with no masses, tenderness or lesions              Bartholin's and Skene's: normal                 Vagina: normal appearing vagina with normal color and discharge, no lesions              Cervix: no cervical motion tenderness, no lesions and normal appearance              Pap taken: Yes.   Bimanual Exam:  Uterus:  normal size, contour, position, consistency, mobility, non-tender and anteverted              Adnexa: normal adnexa and no mass, fullness, tenderness               Rectovaginal: Confirms               Anus:  normal sphincter tone, no lesions  Chaperone present: yes  A:  Well Woman with normal exam  Menopausal no HRT  Hypothyroid no change in symptoms, taking medication as directed.  Vaginal dryness  Cystocele grade 2  PCP management of labs and aex.  Colonoscopy due  P:   Reviewed health and wellness pertinent to exam  Aware of need to advise if vaginal bleeding.  Lab TSH will refill Synthroid once labs in, she has adequate amount until in.  Discussed vaginal dryness noted and again recommended daily moisture to prevent changes or problems like UTI.  Discussed cystocele finding and shown to patient in mirror. Discussed kegel exercise to improve support. Questions addressed.  Continue follow up with PCP as indicated  Aware due end of year, may do or use Cologard, information given. Patient will advise.  Pap smear: yes   counseled on breast self exam, mammography screening, adequate intake of calcium and vitamin D, diet and exercise, Kegel's exercises  return annually or prn  An After Visit Summary was printed and given to the patient.

## 2018-02-04 NOTE — Patient Instructions (Signed)

## 2018-02-05 ENCOUNTER — Other Ambulatory Visit: Payer: Self-pay | Admitting: Certified Nurse Midwife

## 2018-02-05 DIAGNOSIS — E039 Hypothyroidism, unspecified: Secondary | ICD-10-CM

## 2018-02-05 LAB — TSH: TSH: 0.723 u[IU]/mL (ref 0.450–4.500)

## 2018-02-05 MED ORDER — LEVOTHYROXINE SODIUM 75 MCG PO TABS: ORAL_TABLET | ORAL | 12 refills | Status: DC

## 2018-02-06 LAB — CYTOLOGY - PAP
Diagnosis: NEGATIVE
HPV: NOT DETECTED

## 2018-02-09 ENCOUNTER — Other Ambulatory Visit: Payer: Self-pay | Admitting: Certified Nurse Midwife

## 2018-02-09 DIAGNOSIS — Z1231 Encounter for screening mammogram for malignant neoplasm of breast: Secondary | ICD-10-CM

## 2018-03-09 ENCOUNTER — Ambulatory Visit
Admission: RE | Admit: 2018-03-09 | Discharge: 2018-03-09 | Disposition: A | Discharge status: Home / Self Care | Payer: BC Managed Care – PPO | Source: Ambulatory Visit | Attending: Certified Nurse Midwife | Admitting: Certified Nurse Midwife

## 2018-03-09 DIAGNOSIS — Z1231 Encounter for screening mammogram for malignant neoplasm of breast: Secondary | ICD-10-CM

## 2018-06-26 ENCOUNTER — Ambulatory Visit: Payer: BC Managed Care – PPO | Admitting: Certified Nurse Midwife

## 2019-01-20 ENCOUNTER — Other Ambulatory Visit: Payer: Self-pay | Admitting: Certified Nurse Midwife

## 2019-01-20 DIAGNOSIS — E039 Hypothyroidism, unspecified: Secondary | ICD-10-CM

## 2019-01-20 NOTE — Telephone Encounter (Signed)
Medication refill request: Synthroid Last AEX:  02/04/2018 DL Next AEX: 02/10/2019 Last MMG (if hormonal medication request): 03/10/2018 BIRADS 1 Negative Density B Refill authorized:Pending authorization.  #[id number] with no refills if appropriate. Please advise.

## 2019-02-10 ENCOUNTER — Other Ambulatory Visit: Payer: Self-pay | Admitting: Certified Nurse Midwife

## 2019-02-10 ENCOUNTER — Encounter: Payer: Self-pay | Admitting: Certified Nurse Midwife

## 2019-02-10 ENCOUNTER — Other Ambulatory Visit: Payer: Self-pay

## 2019-02-10 ENCOUNTER — Ambulatory Visit: Payer: BC Managed Care – PPO | Admitting: Certified Nurse Midwife

## 2019-02-10 VITALS — BP 118/62 | HR 64 | Temp 97.2°F | Resp 16 | Ht 64.5 in | Wt 137.0 lb

## 2019-02-10 DIAGNOSIS — E039 Hypothyroidism, unspecified: Secondary | ICD-10-CM

## 2019-02-10 DIAGNOSIS — Z780 Asymptomatic menopausal state: Secondary | ICD-10-CM

## 2019-02-10 DIAGNOSIS — Z01419 Encounter for gynecological examination (general) (routine) without abnormal findings: Secondary | ICD-10-CM | POA: Diagnosis not present

## 2019-02-10 DIAGNOSIS — E038 Other specified hypothyroidism: Secondary | ICD-10-CM | POA: Diagnosis not present

## 2019-02-10 DIAGNOSIS — Z1231 Encounter for screening mammogram for malignant neoplasm of breast: Secondary | ICD-10-CM

## 2019-02-10 MED ORDER — LEVOTHYROXINE SODIUM 75 MCG PO TABS: ORAL_TABLET | ORAL | 12 refills | Status: AC

## 2019-02-10 NOTE — Patient Instructions (Signed)

## 2019-02-10 NOTE — Progress Notes (Signed)
63 y.o. [id number] Married  Caucasian Fe here for annual exam.Menopausal no HRT. Denies vaginal bleeding or vaginal dryness. Recent labs with PCP, brought copy, all normal. TSH is normal, will need refill on Synthroid. No health issues in past year. Daughter in college in [place]! No other health issues today.  Patient's last menstrual period was 10/12/2003 (approximate).          Sexually active: Yes.    The current method of family planning is post menopausal status.    Exercising: Yes.    walking Smoker:  no  Review of Systems  Constitutional: Negative.   HENT: Negative.   Eyes: Negative.   Respiratory: Negative.   Cardiovascular: Negative.   Gastrointestinal: Negative.   Genitourinary: Negative.   Musculoskeletal: Negative.   Skin: Negative.   Neurological: Negative.   Endo/Heme/Allergies: Negative.   Psychiatric/Behavioral: Negative.     Health Maintenance: Pap:  12-12-14 neg HPV HR neg, 02-04-18 neg HPV HR neg History of Abnormal Pap: yes, yrs ago, repeat done MMG:  03-09-18 category b density birads 1:neg Self Breast exams: occ Colonoscopy:  2010 normal f/u 14yrs BMD:   2019 normal TDaP:  2012 Shingles: not done Pneumonia: not done Hep C and HIV: HIV neg 2000, Hep c neg 2017 Labs: PCP   reports that she has never smoked. She has never used smokeless tobacco. She reports current alcohol use of about 4.0 standard drinks of alcohol per week. She reports that she does not use drugs.  Past Medical History:  Diagnosis Date  . Abnormal Pap smear of cervix    63 yrs old, just repeat done  . Factor V Leiden ([place])   . Multiple thyroid nodules   . Osteopenia 12/2007  . Osteoporosis   . 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 Protein S deficiency ([place])   . Thyroid disease    hypothyroid    Past Surgical History:  Procedure Laterality Date  . BREAST CYST ASPIRATION Right   . REFRACTIVE SURGERY Right 10/2013   Lasix  . SKIN CANCER EXCISION Left 10/2012   Left clavicle - squamous  cell  . TONSILECTOMY, ADENOIDECTOMY, BILATERAL MYRINGOTOMY AND TUBES     at age 67   . VAGINAL DELIVERY     x2    Current Outpatient Medications  Medication Sig Dispense Refill  . cholecalciferol (VITAMIN D) 1000 UNITS tablet Take 1,000 Units by mouth daily.    Marland Kitchen glucosamine-chondroitin 500-400 MG tablet Take 1 tablet by mouth 3 (three) times daily.    Marland Kitchen levothyroxine (SYNTHROID) 75 MCG tablet TAKE 1 TABLET IN THE MORNING BEFORE BREAKFAST. 30 tablet 0  . Multiple Vitamin (MULTIVITAMIN) tablet Take 1 tablet by mouth daily.     No current facility-administered medications for this visit.     Family History  Adopted: Yes  Problem Relation Age of Onset  . Heart disease Father   . Stroke Father     ROS:  Pertinent items are noted in HPI.  Otherwise, a comprehensive ROS was negative.  Exam:   BP 118/62   Pulse 64   Temp (!) 97.2 F (36.2 C) (Skin)   Resp 16   Ht 5' 4.5" (1.638 m)   Wt 137 lb (62.1 kg)   LMP 10/12/2003 (Approximate)   BMI 23.15 kg/m  Height: 5' 4.5" (163.8 cm) Ht Readings from Last 3 Encounters:  02/10/19 5' 4.5" (1.638 m)  02/04/18 5' 4.75" (1.645 m)  01/02/17 5' 4.75" (1.645 m)    General appearance: alert, cooperative and  appears stated age Head: Normocephalic, without obvious abnormality, atraumatic Neck: no adenopathy, supple, symmetrical, trachea midline and thyroid normal to inspection and palpation Lungs: clear to auscultation bilaterally Breasts: normal appearance, no masses or tenderness, No nipple retraction or dimpling, No nipple discharge or bleeding, No axillary or supraclavicular adenopathy Heart: regular rate and rhythm Abdomen: soft, non-tender; no masses,  no organomegaly Extremities: extremities normal, atraumatic, no cyanosis or edema Skin: Skin color, texture, turgor normal. No rashes or lesions Lymph nodes: Cervical, supraclavicular, and axillary nodes normal. No abnormal inguinal nodes palpated Neurologic: Grossly  normal   Pelvic: External genitalia:  no lesions              Urethra:  normal appearing urethra with no masses, tenderness or lesions              Bartholin's and Skene's: normal                 Vagina: normal appearing vagina with normal color and discharge, no lesions              Cervix: multiparous appearance, no cervical motion tenderness and no lesions              Pap taken: No. Bimanual Exam:  Uterus:  normal size, contour, position, consistency, mobility, non-tender and anteverted              Adnexa: normal adnexa and no mass, fullness, tenderness               Rectovaginal: Confirms               Anus:  normal sphincter tone, no lesions  Chaperone present: yes  A:  Well Woman with normal exam  Menopausal no HRT  Hypothyroid stable on medication ,recent lab normal  Mammogram due will schedule  Recent establishment with PCP with exam and labs  Colonoscopy due will schedule after first of year  P:   Reviewed health and wellness pertinent to exam  Aware of need to advise if vaginal bleeding.  Rx Synthroid see order with instructions  Continue follow up with PCP as indicated.  Pap smear: no   counseled on breast self exam, mammography screening, feminine hygiene, adequate intake of calcium and vitamin D, diet and exercise, Kegel's exercises  return annually or prn  An After Visit Summary was printed and given to the patient.

## 2019-03-25 ENCOUNTER — Ambulatory Visit
Admission: RE | Admit: 2019-03-25 | Discharge: 2019-03-25 | Disposition: A | Discharge status: Home / Self Care | Payer: BC Managed Care – PPO | Source: Ambulatory Visit | Attending: Certified Nurse Midwife | Admitting: Certified Nurse Midwife

## 2019-03-25 ENCOUNTER — Other Ambulatory Visit: Payer: Self-pay

## 2019-03-25 DIAGNOSIS — Z1231 Encounter for screening mammogram for malignant neoplasm of breast: Secondary | ICD-10-CM

## 2019-06-24 ENCOUNTER — Ambulatory Visit: Payer: BC Managed Care – PPO

## 2019-06-25 ENCOUNTER — Ambulatory Visit: Payer: BC Managed Care – PPO | Attending: Internal Medicine

## 2019-06-25 DIAGNOSIS — Z23 Encounter for immunization: Secondary | ICD-10-CM | POA: Insufficient documentation

## 2019-06-25 NOTE — Progress Notes (Signed)
   Covid-19 Vaccination Clinic  Name:  Annaelizabeth Raisbeck    MRN: [id number] DOB: [birth year]  06/25/2019  Ms. Gatling was observed post Covid-19 immunization for 15 minutes without incidence. She was provided with Vaccine Information Sheet and instruction to access the V-Safe system.   Ms. Perrotti was instructed to call 911 with any severe reactions post vaccine: Marland Kitchen Difficulty breathing  . Swelling of your face and throat  . A fast heartbeat  . A bad rash all over your body  . Dizziness and weakness    Immunizations Administered    Name Date Dose VIS Date Route   Pfizer COVID-19 Vaccine 06/25/2019  4:24 PM 0.3 mL 04/23/2019 Intramuscular   Manufacturer: [place]   Lot: [id number]   [place]: [id number]

## 2019-07-18 ENCOUNTER — Ambulatory Visit: Payer: BC Managed Care – PPO | Attending: Internal Medicine

## 2019-07-18 DIAGNOSIS — Z23 Encounter for immunization: Secondary | ICD-10-CM | POA: Insufficient documentation

## 2019-07-18 NOTE — Progress Notes (Signed)
   Covid-19 Vaccination Clinic  Name:  Lanaja Villela    MRN: [id number] DOB: [birth year]  07/18/2019  Ms. Tiffany was observed post Covid-19 immunization for 15 minutes without incident. She was provided with Vaccine Information Sheet and instruction to access the V-Safe system.   Ms. Lassiter was instructed to call 911 with any severe reactions post vaccine: Marland Kitchen Difficulty breathing  . Swelling of face and throat  . A fast heartbeat  . A bad rash all over body  . Dizziness and weakness   Immunizations Administered    Name Date Dose VIS Date Route   Pfizer COVID-19 Vaccine 07/18/2019  8:33 AM 0.3 mL 04/23/2019 Intramuscular   Manufacturer: [place]   Lot: [id number]   [place]: [id number]

## 2019-08-03 ENCOUNTER — Encounter: Payer: Self-pay | Admitting: Certified Nurse Midwife

## 2020-02-14 ENCOUNTER — Ambulatory Visit: Payer: BC Managed Care – PPO | Admitting: Certified Nurse Midwife

## 2020-02-21 ENCOUNTER — Encounter: Payer: Self-pay | Admitting: Obstetrics and Gynecology

## 2020-02-21 ENCOUNTER — Ambulatory Visit: Payer: BC Managed Care – PPO | Admitting: Obstetrics and Gynecology

## 2020-02-21 ENCOUNTER — Other Ambulatory Visit: Payer: Self-pay

## 2020-02-21 VITALS — BP 128/66 | HR 94 | Ht 65.0 in | Wt 136.0 lb

## 2020-02-21 DIAGNOSIS — Z1211 Encounter for screening for malignant neoplasm of colon: Secondary | ICD-10-CM | POA: Diagnosis not present

## 2020-02-21 DIAGNOSIS — Z01419 Encounter for gynecological examination (general) (routine) without abnormal findings: Secondary | ICD-10-CM

## 2020-02-21 NOTE — Progress Notes (Signed)
64 y.o. [id number] Married White or Caucasian Not Hispanic or Latino female here for annual exam.  No vaginal bleeding. Sexually active without penetration (ED).     Went through menopause at 14.   H/O thyroid nodules, on synthroid since her 29's.    Patient's last menstrual period was 10/12/2003 (approximate).          Sexually active: Yes.    The current method of family planning is post menopausal status.    Exercising: Yes.    Walking and yard work  Smoker:  no  Health Maintenance: Pap:  02-04-18 neg HPV HR neg, 12-12-14 neg HPV HR neg History of abnormal Pap:  Yes years ago, repeat done  MMG:  03/26/19 Density B Bi-rads 1 neg  BMD:   2019 WNL  Colonoscopy: 2010 f/u 10 years  TDaP:  11/30/2010 Gardasil: N/A   reports that she has never smoked. She has never used smokeless tobacco. She reports current alcohol use of about 4.0 standard drinks of alcohol per week. She reports that she does not use drugs. 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 She has 2 daughters, youngest a Paramedic at [employer], oldest is in graduate school.   Past Medical History:  Diagnosis Date  . Abnormal Pap smear of cervix    64 yrs old, just repeat done  . Factor V Leiden ([place])   . Multiple thyroid nodules   . Osteopenia 12/2007  . Osteoporosis   . 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 Protein S deficiency ([place])   . Thyroid disease    hypothyroid    Past Surgical History:  Procedure Laterality Date  . BREAST CYST ASPIRATION Right   . REFRACTIVE SURGERY Right 10/2013   Lasix  . 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 TONSILECTOMY, ADENOIDECTOMY, BILATERAL MYRINGOTOMY AND TUBES     at age 59   . VAGINAL DELIVERY     x2    Current Outpatient Medications  Medication Sig Dispense Refill  . cholecalciferol (VITAMIN D) 1000 UNITS tablet Take 1,000 Units by mouth daily.    Marland Kitchen glucosamine-chondroitin 500-400 MG tablet Take 1 tablet by mouth 3 (three) times  daily.    Marland Kitchen levothyroxine (SYNTHROID) 75 MCG tablet TAKE 1 TABLET IN THE MORNING BEFORE BREAKFAST. 30 tablet 12  . Multiple Vitamin (MULTIVITAMIN) tablet Take 1 tablet by mouth daily.     No current facility-administered medications for this visit.    Family History  Adopted: Yes  Problem Relation Age of Onset  . Heart disease Father   . Stroke Father     Review of Systems  All other systems reviewed and are negative.   Exam:   BP 128/66   Pulse 94   Ht 5\' 5"  (1.651 m)   Wt 136 lb (61.7 kg)   LMP 10/12/2003 (Approximate)   SpO2 99%   BMI 22.63 kg/m   Weight change: @WEIGHTCHANGE @ Height:   Height: 5\' 5"  (165.1 cm)  Ht Readings from Last 3 Encounters:  02/21/20 5\' 5"  (1.651 m)  02/10/19 5' 4.5" (1.638 m)  02/04/18 5' 4.75" (1.645 m)    General appearance: alert, cooperative and appears stated age Head: Normocephalic, without obvious abnormality, atraumatic Neck: no adenopathy, supple, symmetrical, trachea midline and thyroid normal to inspection and palpation Lungs: clear to auscultation bilaterally Cardiovascular: regular rate and rhythm Breasts: normal appearance, no masses or tenderness Abdomen: soft, non-tender; non distended,  no masses,  no organomegaly Extremities:  extremities normal, atraumatic, no cyanosis or edema Skin: Skin color, texture, turgor normal. No rashes or lesions Lymph nodes: Cervical, supraclavicular, and axillary nodes normal. No abnormal inguinal nodes palpated Neurologic: Grossly normal   Pelvic: External genitalia:  no lesions              Urethra:  normal appearing urethra with no masses, tenderness or lesions              Bartholins and Skenes: normal                 Vagina: mildly atrophic appearing vagina with normal color and discharge, no lesions              Cervix: no lesions               Bimanual Exam:  Uterus:  normal size, contour, position, consistency, mobility, non-tender              Adnexa: no mass, fullness,  tenderness               Rectovaginal: Confirms               Anus:  normal sphincter tone, no lesions  Shanon Petty chaperoned for the exam.  A:  Well Woman with normal exam  Hypothyroid on synthroid  P:   No pap this year  Colonoscopy is overdue. Referral placed  Mammogram due in 11/21  DEXA UTD and normal  Discussed breast self exam  Discussed calcium and vit D intake  Labs with primary

## 2020-02-21 NOTE — Patient Instructions (Signed)

## 2020-03-06 ENCOUNTER — Encounter: Payer: Self-pay | Admitting: Internal Medicine

## 2020-03-27 ENCOUNTER — Other Ambulatory Visit: Payer: Self-pay

## 2020-03-27 ENCOUNTER — Encounter: Payer: Self-pay | Admitting: Internal Medicine

## 2020-03-27 ENCOUNTER — Ambulatory Visit (AMBULATORY_SURGERY_CENTER): Payer: Self-pay | Admitting: *Deleted

## 2020-03-27 VITALS — Ht 65.0 in | Wt 137.0 lb

## 2020-03-27 DIAGNOSIS — Z1211 Encounter for screening for malignant neoplasm of colon: Secondary | ICD-10-CM

## 2020-03-27 NOTE — Progress Notes (Signed)
No egg or soy allergy known to patient  No issues with past sedation with any surgeries or procedures no intubation problems in the past  No FH of Malignant Hyperthermia No diet pills per patient No home 02 use per patient  No blood thinners per patient  Pt denies issues with constipation  No A fib or A flutter  EMMI video to pt or via [place] 19 guidelines implemented in [place] today with Pt and RN   Coupon given to pt in [facility] today , Code to Pharmacy   Due to the COVID-19 pandemic we are asking patients to follow these guidelines. Please only bring one care partner. Please be aware that your care partner may wait in the car in the parking lot or if they feel like they will be too hot to wait in the car, they may wait in the lobby on the 4th floor. All care partners are required to wear a mask the entire time (we do not have any that we can provide them), they need to practice social distancing, and we will do a Covid check for all patient's and care partners when you arrive. Also we will check their temperature and your temperature. If the care partner waits in their car they need to stay in the parking lot the entire time and we will call them on their cell phone when the patient is ready for discharge so they can bring the car to the front of the building. Also all patient's will need to wear a mask into building.,.

## 2020-03-29 ENCOUNTER — Other Ambulatory Visit: Payer: Self-pay | Admitting: Obstetrics and Gynecology

## 2020-03-29 DIAGNOSIS — Z1231 Encounter for screening mammogram for malignant neoplasm of breast: Secondary | ICD-10-CM

## 2020-04-18 ENCOUNTER — Other Ambulatory Visit: Payer: BC Managed Care – PPO

## 2020-04-18 ENCOUNTER — Ambulatory Visit (AMBULATORY_SURGERY_CENTER): Payer: BC Managed Care – PPO | Admitting: Internal Medicine

## 2020-04-18 ENCOUNTER — Encounter: Payer: Self-pay | Admitting: Internal Medicine

## 2020-04-18 ENCOUNTER — Other Ambulatory Visit: Payer: Self-pay

## 2020-04-18 VITALS — BP 113/58 | HR 66 | Temp 96.6°F | Resp 12 | Ht 65.0 in | Wt 137.0 lb

## 2020-04-18 DIAGNOSIS — Z8379 Family history of other diseases of the digestive system: Secondary | ICD-10-CM

## 2020-04-18 DIAGNOSIS — Z1211 Encounter for screening for malignant neoplasm of colon: Secondary | ICD-10-CM | POA: Diagnosis present

## 2020-04-18 DIAGNOSIS — D123 Benign neoplasm of transverse colon: Secondary | ICD-10-CM

## 2020-04-18 DIAGNOSIS — K635 Polyp of colon: Secondary | ICD-10-CM | POA: Diagnosis not present

## 2020-04-18 MED ORDER — SODIUM CHLORIDE 0.9 % IV SOLN: 500.0000 mL | Freq: Once | INTRAVENOUS | Status: DC

## 2020-04-18 NOTE — Patient Instructions (Addendum)
I found and removed 3 polyps that all look benign.  Some are all could be precancerous, that is common and not unexpected.  Once I get the pathology results I will you let you and your doctors know and when you should repeat a routine colonoscopy.  I appreciate the opportunity to care for you.  Gatha Mayer, MD, [facility]   Handout on polyps.   YOU HAD AN ENDOSCOPIC PROCEDURE TODAY AT [facility]:   Refer to the procedure report that was given to you for any specific questions about what was found during the examination.  If the procedure report does not answer your questions, please call your gastroenterologist to clarify.  If you requested that your care partner not be given the details of your procedure findings, then the procedure report has been included in a sealed envelope for you to review at your convenience later.  YOU SHOULD EXPECT: Some feelings of bloating in the abdomen. Passage of more gas than usual.  Walking can help get rid of the air that was put into your GI tract during the procedure and reduce the bloating. If you had a lower endoscopy (such as a colonoscopy or flexible sigmoidoscopy) you may notice spotting of blood in your stool or on the toilet paper. If you underwent a bowel prep for your procedure, you may not have a normal bowel movement for a few days.  Please Note:  You might notice some irritation and congestion in your nose or some drainage.  This is from the oxygen used during your procedure.  There is no need for concern and it should clear up in a day or so.  SYMPTOMS TO REPORT IMMEDIATELY:   Following lower endoscopy (colonoscopy or flexible sigmoidoscopy):  Excessive amounts of blood in the stool  Significant tenderness or worsening of abdominal pains  Swelling of the abdomen that is new, acute  Fever of 100F or higher   For urgent or emergent issues, a gastroenterologist can be reached at any hour by calling [phone]. Do not use  MyChart messaging for urgent concerns.    DIET:  We do recommend a small meal at first, but then you may proceed to your regular diet.  Drink plenty of fluids but you should avoid alcoholic beverages for 24 hours.  ACTIVITY:  You should plan to take it easy for the rest of today and you should NOT DRIVE or use heavy machinery until tomorrow (because of the sedation medicines used during the test).    FOLLOW UP: Our staff will call the number listed on your records 48-72 hours following your procedure to check on you and address any questions or concerns that you may have regarding the information given to you following your procedure. If we do not reach you, we will leave a message.  We will attempt to reach you two times.  During this call, we will ask if you have developed any symptoms of COVID 19. If you develop any symptoms (ie: fever, flu-like symptoms, shortness of breath, cough etc.) before then, please call [phone].  If you test positive for Covid 19 in the 2 weeks post procedure, please call and report this information to [place].    If any biopsies were taken you will be contacted by phone or by letter within the next 1-3 weeks.  Please call us at [phone] if you have not heard about the biopsies in 3 weeks.    SIGNATURES/CONFIDENTIALITY: You and/or your care partner  have signed paperwork which will be entered into your electronic medical record.  These signatures attest to the fact that that the information above on your After Visit Summary has been reviewed and is understood.  Full responsibility of the confidentiality of this discharge information lies with you and/or your care-partner.

## 2020-04-18 NOTE — Progress Notes (Signed)
A/ox3, pleased with MAC, report to RN 

## 2020-04-18 NOTE — Progress Notes (Signed)
Per Dr. Carlean Purl, he wants patient to go to lab prior to discharge to have celiac test done.

## 2020-04-18 NOTE — Progress Notes (Signed)
Pt's states no medical or surgical changes since previsit or office visit. 

## 2020-04-18 NOTE — Op Note (Addendum)
[place] Patient Name: Adie Vilar Procedure Date: 04/18/2020 11:30 AM MRN: [id number] Endoscopist: Gatha Mayer , MD Age: 64 Referring MD:  Date of Birth: 1956-02-26 Gender: Female Account #: [id number] Procedure:                Colonoscopy Indications:              Screening for colorectal malignant neoplasm, Last                            colonoscopy: 2010 Medicines:                Propofol per Anesthesia, Monitored Anesthesia Care Procedure:                Pre-Anesthesia Assessment:                           - Prior to the procedure, a History and Physical                            was performed, and patient medications and                            allergies were reviewed. The patient's tolerance of                            previous anesthesia was also reviewed. The risks                            and benefits of the procedure and the sedation                            options and risks were discussed with the patient.                            All questions were answered, and informed consent                            was obtained. Prior Anticoagulants: The patient has                            taken no previous anticoagulant or antiplatelet                            agents. ASA Grade Assessment: II - A patient with                            mild systemic disease. After reviewing the risks                            and benefits, the patient was deemed in                            satisfactory condition to undergo the procedure.  After obtaining informed consent, the colonoscope                            was passed under direct vision. Throughout the                            procedure, the patient's blood pressure, pulse, and                            oxygen saturations were monitored continuously. The                            Colonoscope was introduced through the anus and                            advanced to the  the cecum, identified by                            appendiceal orifice and ileocecal valve. The                            colonoscopy was somewhat difficult due to                            significant looping. Successful completion of the                            procedure was aided by applying abdominal pressure.                            The patient tolerated the procedure well. The                            quality of the bowel preparation was good. The                            ileocecal valve, appendiceal orifice, and rectum                            were photographed. The bowel preparation used was                            Miralax via split dose instruction. Scope In: 09:38:18 AM Scope Out: 12:12:34 PM Scope Withdrawal Time: 0 hours 17 minutes 40 seconds  Total Procedure Duration: 0 hours 26 minutes 17 seconds  Findings:                 The perianal and digital rectal examinations were                            normal.                           A 8 to 10 mm polyp was found in the transverse  colon. The polyp was flat. The polyp was removed                            with a piecemeal technique using a cold snare.                            Resection and retrieval were complete. Verification                            of patient identification for the specimen was                            done. Estimated blood loss was minimal.                           Two sessile polyps were found in the sigmoid colon                            and descending colon. The polyps were diminutive in                            size. These polyps were removed with a cold snare.                            Resection and retrieval were complete. Verification                            of patient identification for the specimen was                            done. Estimated blood loss was minimal.                           The exam was otherwise without abnormality on                             direct and retroflexion views. Complications:            No immediate complications. Estimated Blood Loss:     Estimated blood loss was minimal. Impression:               - One 8 to 10 mm polyp in the transverse colon,                            removed piecemeal using a cold snare. Resected and                            retrieved.                           - Two diminutive polyps in the sigmoid colon and in                            the descending colon, removed with a  cold snare.                            Resected and retrieved.                           - The examination was otherwise normal on direct                            and retroflexion views. Recommendation:           - Patient has a contact number available for                            emergencies. The signs and symptoms of potential                            delayed complications were discussed with the                            patient. Return to normal activities tomorrow.                            Written discharge instructions were provided to the                            patient.                           - Resume previous diet.                           - Continue present medications.                           - Repeat colonoscopy is recommended. The                            colonoscopy date will be determined after pathology                            results from today's exam become available for                            review.                           - daughter has celiac disease - will test patient                            for that today TTG Ab and IgA Gatha Mayer, MD 04/18/2020 12:22:04 PM This report has been signed electronically.

## 2020-04-19 LAB — IGA: Immunoglobulin A: 192 mg/dL (ref 70–320)

## 2020-04-19 LAB — TISSUE TRANSGLUTAMINASE, IGA: (tTG) Ab, IgA: <1 U/mL

## 2020-04-20 ENCOUNTER — Telehealth: Payer: Self-pay

## 2020-04-20 NOTE — Telephone Encounter (Signed)
Covid-19 screening questions   Do you now or have you had a fever in the last 14 days? No.  Do you have any respiratory symptoms of shortness of breath or cough now or in the last 14 days? No.  Do you have any family members or close contacts with diagnosed or suspected Covid-19 in the past 14 days? No.  Have you been tested for Covid-19 and found to be positive? No.       Follow up Call-  Call back number 04/18/2020  Post procedure Call Back phone  # [phone]  Permission to leave phone message Yes  Some recent data might be hidden     Patient questions:  Do you have a fever, pain , or abdominal swelling? No. Pain Score  0 *  Have you tolerated food without any problems? Yes.    Have you been able to return to your normal activities? Yes.    Do you have any questions about your discharge instructions: Diet   No. Medications  No. Follow up visit  No.  Do you have questions or concerns about your Care? Yes.  Pt. Wondered when she would receive her results regarding polyps.  Told pt. Results should be received in 2 weeks.  Actions: * If pain score is 4 or above: No action needed, pain <4.

## 2020-04-27 ENCOUNTER — Encounter: Payer: Self-pay | Admitting: Internal Medicine

## 2020-04-27 DIAGNOSIS — Z860100 Personal history of colon polyps, unspecified: Secondary | ICD-10-CM | POA: Insufficient documentation

## 2020-04-27 HISTORY — DX: Personal history of colon polyps, unspecified: Z86.0100

## 2020-04-27 HISTORY — DX: Personal history of colonic polyps: Z86.010

## 2020-04-28 ENCOUNTER — Other Ambulatory Visit: Payer: Self-pay

## 2020-04-28 ENCOUNTER — Other Ambulatory Visit: Payer: Self-pay | Admitting: Family Medicine

## 2020-04-28 DIAGNOSIS — E041 Nontoxic single thyroid nodule: Secondary | ICD-10-CM

## 2020-05-01 ENCOUNTER — Other Ambulatory Visit: Payer: Self-pay | Admitting: Family Medicine

## 2020-05-01 DIAGNOSIS — E039 Hypothyroidism, unspecified: Secondary | ICD-10-CM

## 2020-05-10 ENCOUNTER — Ambulatory Visit: Payer: BC Managed Care – PPO

## 2020-05-10 ENCOUNTER — Ambulatory Visit
Admission: RE | Admit: 2020-05-10 | Discharge: 2020-05-10 | Disposition: A | Discharge status: Home / Self Care | Payer: BC Managed Care – PPO | Source: Ambulatory Visit | Attending: Family Medicine | Admitting: Family Medicine

## 2020-05-10 DIAGNOSIS — E039 Hypothyroidism, unspecified: Secondary | ICD-10-CM

## 2020-06-13 ENCOUNTER — Ambulatory Visit
Admission: RE | Admit: 2020-06-13 | Discharge: 2020-06-13 | Disposition: A | Discharge status: Home / Self Care | Payer: BC Managed Care – PPO | Source: Ambulatory Visit | Attending: Obstetrics and Gynecology | Admitting: Obstetrics and Gynecology

## 2020-06-13 ENCOUNTER — Other Ambulatory Visit: Payer: Self-pay

## 2020-06-13 DIAGNOSIS — Z1231 Encounter for screening mammogram for malignant neoplasm of breast: Secondary | ICD-10-CM

## 2020-06-14 ENCOUNTER — Other Ambulatory Visit: Payer: Self-pay | Admitting: Obstetrics and Gynecology

## 2020-06-14 DIAGNOSIS — R928 Other abnormal and inconclusive findings on diagnostic imaging of breast: Secondary | ICD-10-CM

## 2020-06-19 ENCOUNTER — Other Ambulatory Visit: Payer: Self-pay | Admitting: Obstetrics and Gynecology

## 2020-06-19 ENCOUNTER — Ambulatory Visit
Admission: RE | Admit: 2020-06-19 | Discharge: 2020-06-19 | Disposition: A | Discharge status: Home / Self Care | Payer: BC Managed Care – PPO | Source: Ambulatory Visit | Attending: Obstetrics and Gynecology | Admitting: Obstetrics and Gynecology

## 2020-06-19 ENCOUNTER — Other Ambulatory Visit: Payer: Self-pay

## 2020-06-19 DIAGNOSIS — R928 Other abnormal and inconclusive findings on diagnostic imaging of breast: Secondary | ICD-10-CM

## 2020-06-20 ENCOUNTER — Ambulatory Visit
Admission: RE | Admit: 2020-06-20 | Discharge: 2020-06-20 | Disposition: A | Discharge status: Home / Self Care | Payer: BC Managed Care – PPO | Source: Ambulatory Visit | Attending: Obstetrics and Gynecology | Admitting: Obstetrics and Gynecology

## 2020-06-20 ENCOUNTER — Other Ambulatory Visit: Payer: Self-pay | Admitting: Radiology

## 2020-06-20 DIAGNOSIS — R928 Other abnormal and inconclusive findings on diagnostic imaging of breast: Secondary | ICD-10-CM

## 2020-07-14 ENCOUNTER — Ambulatory Visit: Payer: Self-pay | Admitting: Surgery

## 2020-07-14 DIAGNOSIS — N6311 Unspecified lump in the right breast, upper outer quadrant: Secondary | ICD-10-CM

## 2020-07-14 NOTE — H&P (Signed)
History of Present Illness Brandy Ortiz. Maceo Hernan MD; 07/14/2020 1:00 PM) The patient is a 65 year old female who presents with a breast mass. PCP - Dr. Horald Pollen Referred by Dr. Sumner Boast for right breast mass  This is a 55-year-old female in good health who presents after recent mammogram revealed a possible mass in her right breast. She underwent further workup including ultrasound which showed a 7 x 5 x 6 mm irregular hypoechoic mass in the right breast at 10:00 located 6 cm from the nipple. No adenopathy in the right axilla. 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 This was felt to be discordant so the patient is referred to [place] for lumpectomy for definitive diagnosis. She denies any previous breast surgery. No previous breast cancer history in her family. The patient has been told that she has factor V Leiden deficiency but has never been on any medications or had any treatment or evaluation for this.   Problem List/Past Medical [name] K. Dua Mehler, MD; 07/14/2020 1:00 PM) MASS OF RIGHT BREAST ON MAMMOGRAM (N63.10)  Past Surgical History ([name], CMA; 07/14/2020 9:59 AM) Breast Biopsy Right. Colon Polyp Removal - Colonoscopy Oral Surgery Tonsillectomy  Diagnostic Studies History [name], CMA; 07/14/2020 9:59 AM) Colonoscopy within last year Mammogram within last year Pap Smear 1-5 years ago  Allergies ([name], CMA; 07/14/2020 10:02 AM) Sulfa Drugs Allergies Reconciled  Medication History ([name], CMA; 07/14/2020 10:02 AM) Levothyroxine Sodium (75MCG Tablet, Oral) Active. Medications Reconciled  Social History [name], CMA; 07/14/2020 9:59 AM) Alcohol use Moderate alcohol use. Caffeine use Coffee. No drug use Tobacco use Never smoker.  Family History [name], CMA; 07/14/2020 9:59 AM) Family history unknown First Degree  Relatives  Pregnancy / Birth History [name], CMA; 07/14/2020 9:59 AM) Age at menarche 15 years. Age of menopause <47 Contraceptive History Oral contraceptives. Gravida 3 Length (months) of breastfeeding 7-12 Maternal age 79-25 Para 2 Regular periods  Other Problems Brandy Ortiz. [name], MD; 07/14/2020 1:00 PM) General anesthesia - complications Heart murmur Lump In Breast Other disease, cancer, significant illness Thyroid Disease     Review of Systems ([name] CMA; 07/14/2020 9:59 AM) General Not Present- Appetite Loss, Chills, Fatigue, Fever, Night Sweats, Weight Gain and Weight Loss. Skin Present- Dryness. Not Present- Change in Wart/Mole, Hives, Jaundice, New Lesions, Non-Healing Wounds, Rash and Ulcer. HEENT Present- Hearing Loss, Ringing in the Ears and Seasonal Allergies. Not Present- Earache, Hoarseness, Nose Bleed, Oral Ulcers, Sinus Pain, Sore Throat, Visual Disturbances, Wears glasses/contact lenses and Yellow Eyes. Breast Present- Breast Mass. Not Present- Breast Pain, Nipple Discharge and Skin Changes. Cardiovascular Present- Palpitations. Not Present- Chest Pain, Difficulty Breathing Lying Down, Leg Cramps, Rapid Heart Rate, Shortness of Breath and Swelling of Extremities. Gastrointestinal Not Present- Abdominal Pain, Bloating, Bloody Stool, Change in Bowel Habits, Chronic diarrhea, Constipation, Difficulty Swallowing, Excessive gas, Gets full quickly at meals, Hemorrhoids, Indigestion, Nausea, Rectal Pain and Vomiting. Female Genitourinary Not Present- Frequency, Nocturia, Painful Urination, Pelvic Pain and Urgency. Musculoskeletal Not Present- Back Pain, Joint Pain, Joint Stiffness, Muscle Pain, Muscle Weakness and Swelling of Extremities. Neurological Not Present- Decreased Memory, Fainting, Headaches, Numbness, Seizures, Tingling, Tremor, Trouble walking and Weakness. Psychiatric Not Present- Anxiety, Bipolar, Change in Sleep  Pattern, Depression, Fearful and Frequent crying. Endocrine Present- Hot flashes. Not Present- Cold Intolerance, Excessive Hunger, Hair Changes, Heat Intolerance and New Diabetes.  Vitals ([name] CMA; 07/14/2020 10:03 AM) 07/14/2020 10:02 AM Weight: 142 lb Height: 65in Body Surface Area: 1.71  m Body Mass Index: 23.63 kg/m  Temp.: 97.90F  Pulse: 111 (Regular)  P.OX: 96% (Room air) BP: 116/62(Sitting, Left Arm, Standard)        Physical Exam [name] K. Sylvia Kondracki MD; 07/14/2020 1:00 PM)  The physical exam findings are as follows: Note:Constitutional: WDWN in NAD, conversant, no obvious deformities; resting comfortably Eyes: Pupils equal, round; sclera anicteric; moist conjunctiva; no lid lag HENT: Oral mucosa moist; good dentition Neck: No masses palpated, trachea midline; no thyromegaly Lungs: CTA bilaterally; normal respiratory effort Breasts: Symmetric, no nipple retraction or discharge, no palpable masses, no axillary lymphadenopathy. CV: Regular rate and rhythm; no murmurs; extremities well-perfused with no edema Abd: +bowel sounds, soft, non-tender, no palpable organomegaly; no palpable hernias Musc: Normal gait; no apparent clubbing or cyanosis in extremities Lymphatic: No palpable cervical or axillary lymphadenopathy Skin: Warm, dry; no sign of jaundice Psychiatric - alert and oriented x 4; calm mood and affect    Assessment & Plan [name] K. Maisyn Nouri MD; 07/14/2020 1:01 PM)  MASS OF RIGHT BREAST ON MAMMOGRAM (N63.10)  Current Plans Schedule for Surgery - Right radioactive seed localized lumpectomy. The surgical procedure has been discussed with the patient. Potential risks, benefits, alternative treatments, and expected outcomes have been explained. All of the patient's questions at this time have been answered. The likelihood of reaching the patient's treatment goal is good. The patient understand the proposed surgical procedure and wishes to  proceed. Note:This is a relatively low risk procedure that takes less than an hour. I do not believe that we need to start any blood thinners prior to surgery for her factor V Leiden deficiency.  Brandy Ortiz. Georgette Dover, MD, [facility]  General/ Trauma Surgery   07/14/2020 1:01 PM

## 2020-07-14 NOTE — H&P (View-Only) (Signed)
History of Present Illness Brandy Ortiz. Brandy Belding MD; 07/14/2020 1:00 PM) The patient is a 65 year old female who presents with a breast mass. PCP - Dr. Horald Pollen Referred by Dr. Sumner Boast for right breast mass  This is a 53-year-old female in good health who presents after recent mammogram revealed a possible mass in her right breast. She underwent further workup including ultrasound which showed a 7 x 5 x 6 mm irregular hypoechoic mass in the right breast at 10:00 located 6 cm from the nipple. No adenopathy in the right axilla. 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 This was felt to be discordant so the patient is referred to [place] for lumpectomy for definitive diagnosis. She denies any previous breast surgery. No previous breast cancer history in her family. The patient has been told that she has factor V Leiden deficiency but has never been on any medications or had any treatment or evaluation for this.   Problem List/Past Medical Brandy Key K. Darl Kuss, MD; 07/14/2020 1:00 PM) MASS OF RIGHT BREAST ON MAMMOGRAM (N63.10)  Past Surgical History (Brandy Ortiz, CMA; 07/14/2020 9:59 AM) Breast Biopsy Right. Colon Polyp Removal - Colonoscopy Oral Surgery Tonsillectomy  Diagnostic Studies History Brandy Ortiz, CMA; 07/14/2020 9:59 AM) Colonoscopy within last year Mammogram within last year Pap Smear 1-5 years ago  Allergies (Brandy Ortiz, CMA; 07/14/2020 10:02 AM) Sulfa Drugs Allergies Reconciled  Medication History (Brandy Ortiz, CMA; 07/14/2020 10:02 AM) Levothyroxine Sodium (75MCG Tablet, Oral) Active. Medications Reconciled  Social History Brandy Ortiz, CMA; 07/14/2020 9:59 AM) Alcohol use Moderate alcohol use. Caffeine use Coffee. No drug use Tobacco use Never smoker.  Family History Brandy Ortiz, CMA; 07/14/2020 9:59 AM) Family history unknown First Degree  Relatives  Pregnancy / Birth History Brandy Ortiz, CMA; 07/14/2020 9:59 AM) Age at menarche 74 years. Age of menopause <47 Contraceptive History Oral contraceptives. Gravida 3 Length (months) of breastfeeding 7-12 Maternal age 16-25 Para 2 Regular periods  Other Problems Brandy Ortiz. Brandy Mcentee, MD; 07/14/2020 1:00 PM) General anesthesia - complications Ortiz murmur Lump In Breast Other disease, cancer, significant illness Thyroid Disease     Review of Systems (Brandy Ortiz CMA; 07/14/2020 9:59 AM) General Not Present- Appetite Loss, Chills, Fatigue, Fever, Night Sweats, Weight Gain and Weight Loss. Skin Present- Dryness. Not Present- Change in Wart/Mole, Hives, Jaundice, New Lesions, Non-Healing Wounds, Rash and Ulcer. HEENT Present- Hearing Loss, Ringing in the Ears and Seasonal Allergies. Not Present- Earache, Hoarseness, Nose Bleed, Oral Ulcers, Sinus Pain, Sore Throat, Visual Disturbances, Wears glasses/contact lenses and Yellow Eyes. Breast Present- Breast Mass. Not Present- Breast Pain, Nipple Discharge and Skin Changes. Cardiovascular Present- Palpitations. Not Present- Chest Pain, Difficulty Breathing Lying Down, Leg Cramps, Rapid Ortiz Rate, Shortness of Breath and Swelling of Extremities. Gastrointestinal Not Present- Abdominal Pain, Bloating, Bloody Stool, Change in Bowel Habits, Chronic diarrhea, Constipation, Difficulty Swallowing, Excessive gas, Gets full quickly at meals, Hemorrhoids, Indigestion, Nausea, Rectal Pain and Vomiting. Female Genitourinary Not Present- Frequency, Nocturia, Painful Urination, Pelvic Pain and Urgency. Musculoskeletal Not Present- Back Pain, Joint Pain, Joint Stiffness, Muscle Pain, Muscle Weakness and Swelling of Extremities. Neurological Not Present- Decreased Memory, Fainting, Headaches, Numbness, Seizures, Tingling, Tremor, Trouble walking and Weakness. Psychiatric Not Present- Anxiety, Bipolar, Change in Sleep  Pattern, Depression, Fearful and Frequent crying. Endocrine Present- Hot flashes. Not Present- Cold Intolerance, Excessive Hunger, Hair Changes, Heat Intolerance and New Diabetes.  Vitals (Brandy Ortiz CMA; 07/14/2020 10:03 AM) 07/14/2020 10:02 AM Weight: 142 lb Height: 65in Body Surface Area: 1.71  m Body Mass Index: 23.63 kg/m  Temp.: 97.87F  Pulse: 111 (Regular)  P.OX: 96% (Room air) BP: 116/62(Sitting, Left Arm, Standard)        Physical Exam Brandy Key K. Pearla Mckinny MD; 07/14/2020 1:00 PM)  The physical exam findings are as follows: Note:Constitutional: WDWN in NAD, conversant, no obvious deformities; resting comfortably Eyes: Pupils equal, round; sclera anicteric; moist conjunctiva; no lid lag HENT: Oral mucosa moist; good dentition Neck: No masses palpated, trachea midline; no thyromegaly Lungs: CTA bilaterally; normal respiratory effort Breasts: Symmetric, no nipple retraction or discharge, no palpable masses, no axillary lymphadenopathy. CV: Regular rate and rhythm; no murmurs; extremities well-perfused with no edema Abd: +bowel sounds, soft, non-tender, no palpable organomegaly; no palpable hernias Musc: Normal gait; no apparent clubbing or cyanosis in extremities Lymphatic: No palpable cervical or axillary lymphadenopathy Skin: Warm, dry; no sign of jaundice Psychiatric - alert and oriented x 4; calm mood and affect    Assessment & Plan Brandy Key K. Larnie Heart MD; 07/14/2020 1:01 PM)  MASS OF RIGHT BREAST ON MAMMOGRAM (N63.10)  Current Plans Schedule for Surgery - Right radioactive seed localized lumpectomy. The surgical procedure has been discussed with the patient. Potential risks, benefits, alternative treatments, and expected outcomes have been explained. All of the patient's questions at this time have been answered. The likelihood of reaching the patient's treatment goal is good. The patient understand the proposed surgical procedure and wishes to  proceed. Note:This is a relatively low risk procedure that takes less than an hour. I do not believe that we need to start any blood thinners prior to surgery for her factor V Leiden deficiency.  Brandy Ortiz. Brandy Dover, MD, [facility]  General/ Trauma Surgery   07/14/2020 1:01 PM

## 2020-07-18 ENCOUNTER — Other Ambulatory Visit: Payer: Self-pay | Admitting: Surgery

## 2020-07-18 DIAGNOSIS — N6311 Unspecified lump in the right breast, upper outer quadrant: Secondary | ICD-10-CM

## 2020-08-02 ENCOUNTER — Other Ambulatory Visit: Payer: Self-pay

## 2020-08-02 ENCOUNTER — Encounter (HOSPITAL_BASED_OUTPATIENT_CLINIC_OR_DEPARTMENT_OTHER): Payer: Self-pay | Admitting: Surgery

## 2020-08-05 ENCOUNTER — Inpatient Hospital Stay (HOSPITAL_COMMUNITY): Admission: RE | Admit: 2020-08-05 | Payer: BC Managed Care – PPO | Source: Ambulatory Visit

## 2020-08-07 ENCOUNTER — Other Ambulatory Visit (HOSPITAL_COMMUNITY)
Admission: RE | Admit: 2020-08-07 | Discharge: 2020-08-07 | Disposition: A | Discharge status: Home / Self Care | Payer: BC Managed Care – PPO | Source: Ambulatory Visit | Attending: Surgery | Admitting: Surgery

## 2020-08-07 DIAGNOSIS — N6310 Unspecified lump in the right breast, unspecified quadrant: Secondary | ICD-10-CM | POA: Diagnosis present

## 2020-08-07 DIAGNOSIS — Z01812 Encounter for preprocedural laboratory examination: Secondary | ICD-10-CM | POA: Insufficient documentation

## 2020-08-07 DIAGNOSIS — D6851 Activated protein C resistance: Secondary | ICD-10-CM | POA: Diagnosis not present

## 2020-08-07 DIAGNOSIS — D241 Benign neoplasm of right breast: Secondary | ICD-10-CM | POA: Diagnosis not present

## 2020-08-07 DIAGNOSIS — Z20822 Contact with and (suspected) exposure to covid-19: Secondary | ICD-10-CM | POA: Insufficient documentation

## 2020-08-07 LAB — SARS CORONAVIRUS 2 (TAT 6-24 HRS): SARS Coronavirus 2: NEGATIVE

## 2020-08-08 ENCOUNTER — Other Ambulatory Visit: Payer: Self-pay

## 2020-08-08 ENCOUNTER — Ambulatory Visit
Admission: RE | Admit: 2020-08-08 | Discharge: 2020-08-08 | Disposition: A | Discharge status: Home / Self Care | Payer: BC Managed Care – PPO | Source: Ambulatory Visit | Attending: Surgery | Admitting: Surgery

## 2020-08-08 DIAGNOSIS — N6311 Unspecified lump in the right breast, upper outer quadrant: Secondary | ICD-10-CM

## 2020-08-08 NOTE — Progress Notes (Signed)

## 2020-08-09 ENCOUNTER — Other Ambulatory Visit: Payer: Self-pay

## 2020-08-09 ENCOUNTER — Ambulatory Visit (HOSPITAL_BASED_OUTPATIENT_CLINIC_OR_DEPARTMENT_OTHER)
Admission: RE | Admit: 2020-08-09 | Discharge: 2020-08-09 | Disposition: A | Discharge status: Home / Self Care | Payer: BC Managed Care – PPO | Attending: Surgery | Admitting: Surgery

## 2020-08-09 ENCOUNTER — Encounter (HOSPITAL_BASED_OUTPATIENT_CLINIC_OR_DEPARTMENT_OTHER)
Admission: RE | Disposition: A | Discharge status: Home / Self Care | Payer: Self-pay | Source: Home / Self Care | Attending: Surgery

## 2020-08-09 ENCOUNTER — Ambulatory Visit (HOSPITAL_BASED_OUTPATIENT_CLINIC_OR_DEPARTMENT_OTHER): Payer: BC Managed Care – PPO | Admitting: Anesthesiology

## 2020-08-09 ENCOUNTER — Encounter (HOSPITAL_BASED_OUTPATIENT_CLINIC_OR_DEPARTMENT_OTHER): Payer: Self-pay | Admitting: Surgery

## 2020-08-09 ENCOUNTER — Ambulatory Visit
Admission: RE | Admit: 2020-08-09 | Discharge: 2020-08-09 | Disposition: A | Discharge status: Home / Self Care | Payer: BC Managed Care – PPO | Source: Ambulatory Visit | Attending: Surgery | Admitting: Surgery

## 2020-08-09 DIAGNOSIS — D241 Benign neoplasm of right breast: Secondary | ICD-10-CM | POA: Diagnosis not present

## 2020-08-09 DIAGNOSIS — Z20822 Contact with and (suspected) exposure to covid-19: Secondary | ICD-10-CM | POA: Insufficient documentation

## 2020-08-09 DIAGNOSIS — N6311 Unspecified lump in the right breast, upper outer quadrant: Secondary | ICD-10-CM

## 2020-08-09 DIAGNOSIS — D6851 Activated protein C resistance: Secondary | ICD-10-CM | POA: Insufficient documentation

## 2020-08-09 HISTORY — PX: BREAST LUMPECTOMY WITH RADIOACTIVE SEED LOCALIZATION: SHX6424

## 2020-08-09 HISTORY — PX: BREAST EXCISIONAL BIOPSY: SUR124

## 2020-08-09 HISTORY — DX: Other specified postprocedural states: Z98.890

## 2020-08-09 HISTORY — DX: Other specified postprocedural states: R11.2

## 2020-08-09 SURGERY — BREAST LUMPECTOMY WITH RADIOACTIVE SEED LOCALIZATION
Anesthesia: General | Site: Breast | Laterality: Right

## 2020-08-09 MED ORDER — BUPIVACAINE-EPINEPHRINE 0.25% -1:200000 IJ SOLN
INTRAMUSCULAR | Status: DC | PRN
  Administered 2020-08-09: 10 mL

## 2020-08-09 MED ORDER — AMISULPRIDE (ANTIEMETIC) 5 MG/2ML IV SOLN
INTRAVENOUS | Status: AC
  Filled 2020-08-09: qty 2

## 2020-08-09 MED ORDER — MIDAZOLAM HCL 2 MG/2ML IJ SOLN
INTRAMUSCULAR | Status: AC
  Filled 2020-08-09: qty 2

## 2020-08-09 MED ORDER — AMISULPRIDE (ANTIEMETIC) 5 MG/2ML IV SOLN
INTRAVENOUS | Status: DC | PRN
  Administered 2020-08-09: 5 mg via INTRAVENOUS

## 2020-08-09 MED ORDER — ONDANSETRON HCL 4 MG/2ML IJ SOLN
INTRAMUSCULAR | Status: DC | PRN
  Administered 2020-08-09: 4 mg via INTRAVENOUS

## 2020-08-09 MED ORDER — KETOROLAC TROMETHAMINE 30 MG/ML IJ SOLN: 30.0000 mg | Freq: Once | INTRAMUSCULAR | Status: DC | PRN

## 2020-08-09 MED ORDER — ACETAMINOPHEN 500 MG PO TABS
1000.0000 mg | ORAL_TABLET | ORAL | Status: AC
  Administered 2020-08-09: 1000 mg via ORAL

## 2020-08-09 MED ORDER — LIDOCAINE HCL (CARDIAC) PF 100 MG/5ML IV SOSY
PREFILLED_SYRINGE | INTRAVENOUS | Status: DC | PRN
  Administered 2020-08-09: 60 mg via INTRATRACHEAL

## 2020-08-09 MED ORDER — PROPOFOL 10 MG/ML IV BOLUS
INTRAVENOUS | Status: DC | PRN
Start: 2020-08-09 — End: 2020-08-09
  Administered 2020-08-09: 150 mg via INTRAVENOUS

## 2020-08-09 MED ORDER — FENTANYL CITRATE (PF) 100 MCG/2ML IJ SOLN
INTRAMUSCULAR | Status: AC
  Filled 2020-08-09: qty 2

## 2020-08-09 MED ORDER — FENTANYL CITRATE (PF) 100 MCG/2ML IJ SOLN
25.0000 ug | INTRAMUSCULAR | Status: DC | PRN
Start: 2020-08-09 — End: 2020-08-09

## 2020-08-09 MED ORDER — PROPOFOL 10 MG/ML IV BOLUS
INTRAVENOUS | Status: AC
  Filled 2020-08-09: qty 20

## 2020-08-09 MED ORDER — CHLORHEXIDINE GLUCONATE CLOTH 2 % EX PADS: 6.0000 | MEDICATED_PAD | Freq: Once | CUTANEOUS | Status: DC

## 2020-08-09 MED ORDER — DEXAMETHASONE SODIUM PHOSPHATE 10 MG/ML IJ SOLN
INTRAMUSCULAR | Status: AC
  Filled 2020-08-09: qty 1

## 2020-08-09 MED ORDER — OXYCODONE HCL 5 MG PO TABS: 5.0000 mg | ORAL_TABLET | Freq: Once | ORAL | Status: DC | PRN

## 2020-08-09 MED ORDER — FENTANYL CITRATE (PF) 100 MCG/2ML IJ SOLN
INTRAMUSCULAR | Status: DC | PRN
  Administered 2020-08-09: 50 ug via INTRAVENOUS

## 2020-08-09 MED ORDER — CEFAZOLIN SODIUM-DEXTROSE 2-4 GM/100ML-% IV SOLN
2.0000 g | INTRAVENOUS | Status: AC
  Administered 2020-08-09: 2 g via INTRAVENOUS

## 2020-08-09 MED ORDER — SCOPOLAMINE 1 MG/3DAYS TD PT72
MEDICATED_PATCH | TRANSDERMAL | Status: AC
  Filled 2020-08-09: qty 1

## 2020-08-09 MED ORDER — AMISULPRIDE (ANTIEMETIC) 5 MG/2ML IV SOLN: 5.0000 mg | Freq: Once | INTRAVENOUS | Status: DC | PRN

## 2020-08-09 MED ORDER — LACTATED RINGERS IV SOLN: INTRAVENOUS | Status: DC

## 2020-08-09 MED ORDER — DEXAMETHASONE SODIUM PHOSPHATE 10 MG/ML IJ SOLN
INTRAMUSCULAR | Status: DC | PRN
  Administered 2020-08-09: 5 mg via INTRAVENOUS

## 2020-08-09 MED ORDER — SCOPOLAMINE 1 MG/3DAYS TD PT72
1.0000 | MEDICATED_PATCH | TRANSDERMAL | Status: DC
  Administered 2020-08-09: 1.5 mg via TRANSDERMAL

## 2020-08-09 MED ORDER — CEFAZOLIN SODIUM-DEXTROSE 2-4 GM/100ML-% IV SOLN
INTRAVENOUS | Status: AC
  Filled 2020-08-09: qty 100

## 2020-08-09 MED ORDER — EPHEDRINE SULFATE 50 MG/ML IJ SOLN
INTRAMUSCULAR | Status: DC | PRN
  Administered 2020-08-09: 10 mg via INTRAVENOUS

## 2020-08-09 MED ORDER — ONDANSETRON HCL 4 MG/2ML IJ SOLN
INTRAMUSCULAR | Status: AC
  Filled 2020-08-09: qty 2

## 2020-08-09 MED ORDER — EPHEDRINE 5 MG/ML INJ
INTRAVENOUS | Status: AC
  Filled 2020-08-09: qty 10

## 2020-08-09 MED ORDER — MIDAZOLAM HCL 5 MG/5ML IJ SOLN
INTRAMUSCULAR | Status: DC | PRN
  Administered 2020-08-09: 2 mg via INTRAVENOUS

## 2020-08-09 MED ORDER — LIDOCAINE 2% (20 MG/ML) 5 ML SYRINGE
INTRAMUSCULAR | Status: AC
  Filled 2020-08-09: qty 5

## 2020-08-09 MED ORDER — ACETAMINOPHEN 500 MG PO TABS
ORAL_TABLET | ORAL | Status: AC
  Filled 2020-08-09: qty 2

## 2020-08-09 MED ORDER — PROMETHAZINE HCL 25 MG/ML IJ SOLN: 6.2500 mg | INTRAMUSCULAR | Status: DC | PRN

## 2020-08-09 MED ORDER — OXYCODONE HCL 5 MG/5ML PO SOLN: 5.0000 mg | Freq: Once | ORAL | Status: DC | PRN

## 2020-08-09 MED ORDER — BUPIVACAINE-EPINEPHRINE (PF) 0.25% -1:200000 IJ SOLN
INTRAMUSCULAR | Status: AC
  Filled 2020-08-09: qty 30

## 2020-08-09 SURGICAL SUPPLY — 50 items
APL PRP STRL LF DISP 70% ISPRP (MISCELLANEOUS) ×1
APL SKNCLS STERI-STRIP NONHPOA (GAUZE/BANDAGES/DRESSINGS) ×1
APPLIER CLIP 9.375 MED OPEN (MISCELLANEOUS) ×2
APR CLP MED 9.3 20 MLT OPN (MISCELLANEOUS) ×1
BENZOIN TINCTURE PRP APPL 2/3 (GAUZE/BANDAGES/DRESSINGS) ×2 IMPLANT
BLADE HEX COATED 2.75 (ELECTRODE) ×2 IMPLANT
BLADE SURG 15 STRL LF DISP TIS (BLADE) ×1 IMPLANT
BLADE SURG 15 STRL SS (BLADE) ×2
CANISTER SUC SOCK COL 7IN (MISCELLANEOUS) IMPLANT
CANISTER SUCT 1200ML W/VALVE (MISCELLANEOUS) IMPLANT
CHLORAPREP W/TINT 26 (MISCELLANEOUS) ×2 IMPLANT
CLIP APPLIE 9.375 MED OPEN (MISCELLANEOUS) ×1 IMPLANT
COVER BACK TABLE 60X90IN (DRAPES) ×2 IMPLANT
COVER MAYO STAND STRL (DRAPES) ×2 IMPLANT
COVER PROBE W GEL 5X96 (DRAPES) ×2 IMPLANT
COVER WAND RF STERILE (DRAPES) IMPLANT
DECANTER SPIKE VIAL GLASS SM (MISCELLANEOUS) IMPLANT
DRAPE LAPAROTOMY 100X72 PEDS (DRAPES) ×2 IMPLANT
DRAPE UTILITY XL STRL (DRAPES) ×2 IMPLANT
DRSG TEGADERM 4X4.75 (GAUZE/BANDAGES/DRESSINGS) ×2 IMPLANT
ELECT REM PT RETURN 9FT ADLT (ELECTROSURGICAL) ×2
ELECTRODE REM PT RTRN 9FT ADLT (ELECTROSURGICAL) ×1 IMPLANT
GAUZE SPONGE 4X4 12PLY STRL LF (GAUZE/BANDAGES/DRESSINGS) IMPLANT
GLOVE SURG ENC MOIS LTX SZ7 (GLOVE) ×2 IMPLANT
GLOVE SURG UNDER POLY LF SZ7.5 (GLOVE) ×2 IMPLANT
GOWN STRL REUS W/ TWL LRG LVL3 (GOWN DISPOSABLE) ×2 IMPLANT
GOWN STRL REUS W/TWL LRG LVL3 (GOWN DISPOSABLE) ×4
ILLUMINATOR WAVEGUIDE N/F (MISCELLANEOUS) IMPLANT
KIT MARKER MARGIN INK (KITS) ×2 IMPLANT
LIGHT WAVEGUIDE WIDE FLAT (MISCELLANEOUS) IMPLANT
NDL HYPO 25X1 1.5 SAFETY (NEEDLE) ×1 IMPLANT
NEEDLE HYPO 25X1 1.5 SAFETY (NEEDLE) ×2 IMPLANT
NS IRRIG 1000ML POUR BTL (IV SOLUTION) ×2 IMPLANT
PACK BASIN DAY SURGERY FS (CUSTOM PROCEDURE TRAY) ×2 IMPLANT
PENCIL SMOKE EVACUATOR (MISCELLANEOUS) ×2 IMPLANT
SLEEVE SCD COMPRESS KNEE MED (STOCKING) ×2 IMPLANT
SPONGE GAUZE 2X2 8PLY STRL LF (GAUZE/BANDAGES/DRESSINGS) IMPLANT
SPONGE LAP 18X18 RF (DISPOSABLE) IMPLANT
SPONGE LAP 4X18 RFD (DISPOSABLE) ×2 IMPLANT
STRIP CLOSURE SKIN 1/2X4 (GAUZE/BANDAGES/DRESSINGS) ×2 IMPLANT
SUT MON AB 4-0 PC3 18 (SUTURE) ×2 IMPLANT
SUT SILK 2 0 SH (SUTURE) IMPLANT
SUT VIC AB 3-0 SH 27 (SUTURE) ×2
SUT VIC AB 3-0 SH 27X BRD (SUTURE) ×1 IMPLANT
SYR BULB EAR ULCER 3OZ GRN STR (SYRINGE) IMPLANT
SYR CONTROL 10ML LL (SYRINGE) ×2 IMPLANT
TOWEL GREEN STERILE FF (TOWEL DISPOSABLE) ×2 IMPLANT
TRAY FAXITRON CT DISP (TRAY / TRAY PROCEDURE) ×2 IMPLANT
TUBE CONNECTING 20X1/4 (TUBING) IMPLANT
YANKAUER SUCT BULB TIP NO VENT (SUCTIONS) IMPLANT

## 2020-08-09 NOTE — Interval H&P Note (Signed)
History and Physical Interval Note:  08/09/2020 7:26 AM  Brandy Ortiz  has presented today for surgery, with the diagnosis of RIGHT BREAST MASS.  The various methods of treatment have been discussed with the patient and family. After consideration of risks, benefits and other options for treatment, the patient has consented to  Procedure(s): RIGHT BREAST LUMPECTOMY WITH RADIOACTIVE SEED LOCALIZATION (Right) as a surgical intervention.  The patient's history has been reviewed, patient examined, no change in status, stable for surgery.  I have reviewed the patient's chart and labs.  Questions were answered to the patient's satisfaction.     Maia Petties

## 2020-08-09 NOTE — Anesthesia Postprocedure Evaluation (Signed)
Anesthesia Post Note  Patient: Brandy Ortiz  Procedure(s) Performed: RIGHT BREAST LUMPECTOMY WITH RADIOACTIVE SEED LOCALIZATION (Right Breast)     Patient location during evaluation: PACU Anesthesia Type: General Level of consciousness: awake Pain management: pain level controlled Vital Signs Assessment: post-procedure vital signs reviewed and stable Respiratory status: spontaneous breathing, nonlabored ventilation, respiratory function stable and patient connected to nasal cannula oxygen Cardiovascular status: blood pressure returned to baseline and stable Postop Assessment: no apparent nausea or vomiting Anesthetic complications: no   No complications documented.  Last Vitals:  Vitals:   08/09/20 0845 08/09/20 0904  BP: (!) 107/56 (!) 117/54  Pulse: 75 72  Resp: 20 18  Temp:  36.7 C  SpO2: 100% 100%    Last Pain:  Vitals:   08/09/20 0904  TempSrc:   PainSc: 3                  [name]

## 2020-08-09 NOTE — Transfer of Care (Signed)
Immediate Anesthesia Transfer of Care Note  Patient: Brandy Ortiz  Procedure(s) Performed: RIGHT BREAST LUMPECTOMY WITH RADIOACTIVE SEED LOCALIZATION (Right Breast)  Patient Location: PACU  Anesthesia Type:General  Level of Consciousness: awake, alert  and oriented  Airway & Oxygen Therapy: Patient Spontanous Breathing and Patient connected to face mask oxygen  Post-op Assessment: Report given to RN and Post -op Vital signs reviewed and stable  Post vital signs: Reviewed and stable  Last Vitals:  Vitals Value Taken Time  BP 105/67 08/09/20 0825  Temp    Pulse    Resp 16 08/09/20 0826  SpO2    Vitals shown include unvalidated device data.  Last Pain:  Vitals:   08/09/20 0648  TempSrc: Oral  PainSc: 0-No pain         Complications: No complications documented.

## 2020-08-09 NOTE — Anesthesia Procedure Notes (Signed)
Procedure Name: LMA Insertion Date/Time: 08/09/2020 7:41 AM Performed by: Glory Buff, CRNA Pre-anesthesia Checklist: Patient identified, Emergency Drugs available, Suction available and Patient being monitored Patient Re-evaluated:Patient Re-evaluated prior to induction Oxygen Delivery Method: Circle system utilized Preoxygenation: Pre-oxygenation with 100% oxygen Induction Type: IV induction LMA Size: 4.0 Number of attempts: 1 Placement Confirmation: positive ETCO2 Tube secured with: Tape Dental Injury: Teeth and Oropharynx as per pre-operative assessment

## 2020-08-09 NOTE — Op Note (Signed)
Pre-op Diagnosis:  Right breast mass Post-op Diagnosis: same Procedure:  Right radioactive seed localized lumpectomy Surgeon:  Dwane Andres K. Assistant:  Carlena Hurl, PA-C Anesthesia:  GEN - LMA Indications:  This is a 65 year old female in good health who presents after recent mammogram revealed a possible mass in her right breast. She underwent further workup including ultrasound which showed a 7 x 5 x 6 mm irregular hypoechoic mass in the right breast at 10:00 located 6 cm from the nipple. No adenopathy in the right axilla. 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 This was felt to be discordant so the patient is referred to [place] for lumpectomy for definitive diagnosis. She denies any previous breast surgery. No previous breast cancer history in her family.  Description of procedure: The patient is brought to the operating room placed in supine position on the operating room table. After an adequate level of general anesthesia was obtained, her right breast was prepped with ChloraPrep and draped in sterile fashion. A timeout was taken to ensure the proper patient and proper procedure. We interrogated the breast with the neoprobe. We made a transverse incision over the mass in the RUOQ after infiltrating with 0.25% Marcaine. Dissection was carried down in the breast tissue with cautery. We used the neoprobe to guide [place] towards the radioactive seed. We excised an area of tissue around the radioactive seed 2 cm in diameter. The specimen was removed and was oriented with a paint kit. Specimen mammogram showed the radioactive seed as well as the biopsy clip within the specimen. This was sent for pathologic examination. There is no residual radioactivity within the biopsy cavity. We inspected carefully for hemostasis. The wound was thoroughly irrigated. The wound was closed with a deep layer of 3-0 Vicryl and a subcuticular layer of 4-0 Monocryl. Benzoin Steri-Strips were  applied. The patient was then extubated and brought to the recovery room in stable condition. All sponge, instrument, and needle counts are correct.  Imogene Burn. Georgette Dover, MD, [facility]  General/ Trauma Surgery  08/09/2020 8:08 AM

## 2020-08-09 NOTE — Anesthesia Preprocedure Evaluation (Addendum)
Anesthesia Evaluation  Patient identified by MRN, date of birth, ID band Patient awake    Reviewed: Allergy & Precautions, NPO status , Patient's Chart, lab work & pertinent test results  History of Anesthesia Complications (+) PONV and history of anesthetic complications  Airway Mallampati: II  TM Distance: >3 FB Neck ROM: Full    Dental no notable dental hx.    Pulmonary neg pulmonary ROS,    Pulmonary exam normal breath sounds clear to auscultation       Cardiovascular negative cardio ROS Normal cardiovascular exam Rhythm:Regular Rate:Normal     Neuro/Psych negative neurological ROS  negative psych ROS   GI/Hepatic negative GI ROS, Neg liver ROS,   Endo/Other  Hypothyroidism   Renal/GU negative Renal ROS     Musculoskeletal negative musculoskeletal ROS (+)   Abdominal   Peds  Hematology Factor V Leiden  Protein S deficiency   Anesthesia Other Findings RIGHT BREAST MASS  Reproductive/Obstetrics                            Anesthesia Physical Anesthesia Plan  ASA: II  Anesthesia Plan: General   Post-op Pain Management:    Induction: Intravenous  PONV Risk Score and Plan: 4 or greater and Ondansetron, Dexamethasone, Midazolam, Amisulpride, Treatment may vary due to age or medical condition and Scopolamine patch - Pre-op  Airway Management Planned: LMA  Additional Equipment:   Intra-op Plan:   Post-operative Plan: Extubation in OR  Informed Consent: I have reviewed the patients History and Physical, chart, labs and discussed the procedure including the risks, benefits and alternatives for the proposed anesthesia with the patient or authorized representative who has indicated his/her understanding and acceptance.     Dental advisory given  Plan Discussed with: CRNA  Anesthesia Plan Comments:        Anesthesia Quick Evaluation

## 2020-08-09 NOTE — Discharge Instructions (Signed)
Brandy Ortiz Office Phone Number [phone]  BREAST BIOPSY/ PARTIAL MASTECTOMY: POST OP INSTRUCTIONS  Always review your discharge instruction sheet given to you by the facility where your surgery was performed.  IF YOU HAVE DISABILITY OR FAMILY LEAVE FORMS, YOU MUST BRING THEM TO THE OFFICE FOR PROCESSING.  DO NOT GIVE THEM TO YOUR DOCTOR.  1. A prescription for pain medication may be given to you upon discharge.  Take your pain medication as prescribed, if needed.  If narcotic pain medicine is not needed, then you may take acetaminophen (Tylenol) or ibuprofen (Advil) as needed. 2. Take your usually prescribed medications unless otherwise directed 3. If you need a refill on your pain medication, please contact your pharmacy.  They will contact our office to request authorization.  Prescriptions will not be filled after 5pm or on week-ends. 4. You should eat very light the first 24 hours after surgery, such as soup, crackers, pudding, etc.  Resume your normal diet the day after surgery. 5. Most patients will experience some swelling and bruising in the breast.  Ice packs and a good support bra will help.  Swelling and bruising can take several days to resolve.  6. It is common to experience some constipation if taking pain medication after surgery.  Increasing fluid intake and taking a stool softener will usually help or prevent this problem from occurring.  A mild laxative (Milk of Magnesia or Miralax) should be taken according to package directions if there are no bowel movements after 48 hours. 7. Unless discharge instructions indicate otherwise, you may remove your bandages 24-48 hours after surgery, and you may shower at that time.  You may have steri-strips (small skin tapes) in place directly over the incision.  These strips should be left on the skin for 7-10 days.  If your surgeon used skin glue on the incision, you may shower in 24 hours.  The glue will flake off over the  next 2-3 weeks.  Any sutures or staples will be removed at the office during your follow-up visit. 8. ACTIVITIES:  You may resume regular daily activities (gradually increasing) beginning the next day.  Wearing a good support bra or sports bra minimizes pain and swelling.  You may have sexual intercourse when it is comfortable. a. You may drive when you no longer are taking prescription pain medication, you can comfortably wear a seatbelt, and you can safely maneuver your car and apply brakes. b. RETURN TO WORK:  ______________________________________________________________________________________ 9. You should see your doctor in the office for a follow-up appointment approximately two weeks after your surgery.  Your doctor's nurse will typically make your follow-up appointment when she calls you with your pathology report.  Expect your pathology report 2-3 business days after your surgery.  You may call to check if you do not hear from [place] after three days. 10. OTHER INSTRUCTIONS: _______________________________________________________________________________________________ _____________________________________________________________________________________________________________________________________ _____________________________________________________________________________________________________________________________________ _____________________________________________________________________________________________________________________________________  WHEN TO CALL YOUR DOCTOR: 1. Fever over 101.0 2. Nausea and/or vomiting. 3. Extreme swelling or bruising. 4. Continued bleeding from incision. 5. Increased pain, redness, or drainage from the incision.  The clinic staff is available to answer your questions during regular business hours.  Please don't hesitate to call and ask to speak to one of the nurses for clinical concerns.  If you have a medical emergency, go to the nearest  emergency room or call 911.  A surgeon from [facility] is always on call at the hospital.  For further questions, please visit [email or web address]  Post Anesthesia Home Care Instructions  Activity: Get plenty of rest for the remainder of the day. A responsible individual must stay with you for 24 hours following the procedure.  For the next 24 hours, DO NOT: -Drive a car -Paediatric nurse -Drink alcoholic beverages -Take any medication unless instructed by your physician -Make any legal decisions or sign important papers.  Meals: Start with liquid foods such as gelatin or soup. Progress to regular foods as tolerated. Avoid greasy, spicy, heavy foods. If nausea and/or vomiting occur, drink only clear liquids until the nausea and/or vomiting subsides. Call your physician if vomiting continues.  Special Instructions/Symptoms: Your throat may feel dry or sore from the anesthesia or the breathing tube placed in your throat during surgery. If this causes discomfort, gargle with warm salt water. The discomfort should disappear within 24 hours.  If you had a scopolamine patch placed behind your ear for the management of post- operative nausea and/or vomiting:  1. The medication in the patch is effective for 72 hours, after which it should be removed.  Wrap patch in a tissue and discard in the trash. Wash hands thoroughly with soap and water. 2. You may remove the patch earlier than 72 hours if you experience unpleasant side effects which may include dry mouth, dizziness or visual disturbances. 3. Avoid touching the patch. Wash your hands with soap and water after contact with the patch.    No Tylenol until 12:50PM.

## 2020-08-10 ENCOUNTER — Encounter (HOSPITAL_BASED_OUTPATIENT_CLINIC_OR_DEPARTMENT_OTHER): Payer: Self-pay | Admitting: Surgery

## 2020-08-11 LAB — SURGICAL PATHOLOGY

## 2021-02-22 ENCOUNTER — Other Ambulatory Visit: Payer: Self-pay

## 2021-02-22 ENCOUNTER — Ambulatory Visit (INDEPENDENT_AMBULATORY_CARE_PROVIDER_SITE_OTHER): Payer: BC Managed Care – PPO | Admitting: Obstetrics and Gynecology

## 2021-02-22 ENCOUNTER — Encounter: Payer: Self-pay | Admitting: Obstetrics and Gynecology

## 2021-02-22 VITALS — BP 140/86 | HR 70 | Resp 12 | Ht 65.0 in | Wt 136.0 lb

## 2021-02-22 DIAGNOSIS — Z01419 Encounter for gynecological examination (general) (routine) without abnormal findings: Secondary | ICD-10-CM | POA: Diagnosis not present

## 2021-02-22 DIAGNOSIS — Z8739 Personal history of other diseases of the musculoskeletal system and connective tissue: Secondary | ICD-10-CM

## 2021-02-22 DIAGNOSIS — E2839 Other primary ovarian failure: Secondary | ICD-10-CM | POA: Diagnosis not present

## 2021-02-22 NOTE — Progress Notes (Signed)
65 y.o. [id number] Married White or Caucasian Not Hispanic or Latino female here for annual exam.  No vaginal bleeding. Sexually active without penetration, no pain.  No bowel or bladder c/o.    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  Went through menopause at 51.    H/O thyroid nodules, on synthroid since her 87's.     Patient's last menstrual period was 10/12/2003 (approximate).          Sexually active: Yes.    The current method of family planning is post menopausal status.    Exercising: Yes.     walking , yard work  Smoker:  no  Health Maintenance: Pap:  02-04-18 negative, HR HPV negative History of abnormal Pap:  no h/o dysplasia.  MMG:  06-22-20 right breast biopsy-- fibroadenoma  BMD:   08-14-17 osteopenia, T score -1.4, FRAX 7.4/0.6% Colonoscopy: 04-18-20, polyps noted,  f/u 3 years  TDaP:  11/30/2010 Gardasil: no   reports that she has never smoked. She has never used smokeless tobacco. She reports current alcohol use of about 4.0 standard drinks per week. She reports that she does not use drugs. 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 She has 2 daughters, youngest is a Equities trader at [employer], oldest is a for speech pathology. Older daughter is doing a fellowship in [place] at a school for kids with Autism.   Past Medical History:  Diagnosis Date   Abnormal Pap smear of cervix    65 yrs old, just repeat done   Factor V Leiden ([place])    Hx of sessile serrated colonic polyps 04/27/2020   Multiple thyroid nodules    Osteopenia 12/2007   Osteoporosis    PONV (postoperative nausea and vomiting)    Protein S deficiency ([place])    Thyroid disease    hypothyroid    Past Surgical History:  Procedure Laterality Date   BREAST CYST ASPIRATION Right    BREAST LUMPECTOMY WITH RADIOACTIVE SEED LOCALIZATION Right 08/09/2020   Procedure: RIGHT BREAST LUMPECTOMY WITH RADIOACTIVE SEED LOCALIZATION;  Surgeon: Donnie Mesa, MD;  Location: [place];  Service:  General;  Laterality: Right;   REFRACTIVE SURGERY Right 10/2013   Lasix   SKIN CANCER EXCISION Left 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     CALCIUM PO Take by mouth.     CINNAMON PO Take by mouth.     Cyanocobalamin (VITAMIN B-12 PO) Take by mouth.     ergocalciferol (VITAMIN D2) 1.25 MG (50000 UT) capsule Take 50,000 Units by mouth once a week.     glucosamine-chondroitin 500-400 MG tablet Take 1 tablet by mouth 3 (three) times daily.     levothyroxine (SYNTHROID) 75 MCG tablet TAKE 1 TABLET IN THE MORNING BEFORE BREAKFAST. 30 tablet 12   Multiple Vitamin (MULTIVITAMIN) tablet Take 1 tablet by mouth daily.     No current facility-administered medications for this visit.    Family History  Adopted: Yes  Problem Relation Age of Onset   Heart disease Father    Stroke Father     Review of Systems  All other systems reviewed and are negative.  Exam:   BP 140/86 (BP Location: Right Arm, Patient  Position: Sitting, Cuff Size: Normal)   Pulse 70   Resp 12   Ht 5\' 5"  (1.651 m)   Wt 136 lb (61.7 kg)   LMP 10/12/2003 (Approximate)   BMI 22.63 kg/m   Weight change: @WEIGHTCHANGE @ Height:   Height: 5\' 5"  (165.1 cm)  Ht Readings from Last 3 Encounters:  02/22/21 5\' 5"  (1.651 m)  08/09/20 5\' 5"  (1.651 m)  04/18/20 5\' 5"  (1.651 m)    General appearance: alert, cooperative and appears stated age Head: Normocephalic, without obvious abnormality, atraumatic Neck: no adenopathy, supple, symmetrical, trachea midline and thyroid normal to inspection and palpation Lungs: clear to auscultation bilaterally Cardiovascular: regular rate and rhythm Breasts: normal appearance, no masses or tenderness Abdomen: soft, non-tender; non distended,   no masses,  no organomegaly Extremities: extremities normal, atraumatic, no cyanosis or edema Skin: Skin color, texture, turgor normal. No rashes or lesions Lymph nodes: Cervical, supraclavicular, and axillary nodes normal. No abnormal inguinal nodes palpated Neurologic: Grossly normal   Pelvic: External genitalia:  no lesions              Urethra:  normal appearing urethra with no masses, tenderness or lesions              Bartholins and Skenes: normal                 Vagina: atrophic appearing vagina with normal color and discharge, no lesions              Cervix: no lesions               Bimanual Exam:  Uterus:  normal size, contour, position, consistency, mobility, non-tender              Adnexa: no mass, fullness, tenderness               Rectovaginal: Confirms               Anus:  normal sphincter tone, no lesions  Karmen Bongo chaperoned for the exam.  1. Well woman exam Discussed breast self exam Mammogram due in 2/23 Colonoscopy UTD Labs with primary  2. History of osteopenia Discussed calcium and vit D intake - DG Bone Density; Future  3. Hypoestrogenism - DG Bone Density; Future

## 2021-02-22 NOTE — Patient Instructions (Signed)

## 2021-02-23 ENCOUNTER — Other Ambulatory Visit: Payer: Self-pay | Admitting: Obstetrics and Gynecology

## 2021-02-23 DIAGNOSIS — Z1231 Encounter for screening mammogram for malignant neoplasm of breast: Secondary | ICD-10-CM

## 2021-02-23 DIAGNOSIS — Z8739 Personal history of other diseases of the musculoskeletal system and connective tissue: Secondary | ICD-10-CM

## 2021-02-23 DIAGNOSIS — E2839 Other primary ovarian failure: Secondary | ICD-10-CM

## 2021-07-05 ENCOUNTER — Ambulatory Visit
Admission: RE | Admit: 2021-07-05 | Discharge: 2021-07-05 | Disposition: A | Discharge status: Home / Self Care | Payer: BC Managed Care – PPO | Source: Ambulatory Visit | Attending: Obstetrics and Gynecology | Admitting: Obstetrics and Gynecology

## 2021-07-05 DIAGNOSIS — E2839 Other primary ovarian failure: Secondary | ICD-10-CM

## 2021-07-05 DIAGNOSIS — Z1231 Encounter for screening mammogram for malignant neoplasm of breast: Secondary | ICD-10-CM

## 2021-07-05 DIAGNOSIS — Z8739 Personal history of other diseases of the musculoskeletal system and connective tissue: Secondary | ICD-10-CM

## 2021-11-15 ENCOUNTER — Encounter (HOSPITAL_COMMUNITY): Payer: Self-pay

## 2022-02-28 NOTE — Progress Notes (Signed)
66 y.o. [id number] Married White or Caucasian Not Hispanic or Latino female here for annual exam.  No vaginal bleeding. Not sexually active.   No bowel or bladder c/o.    H/O thyroid nodules, on synthroid since her 77's.     Patient's last menstrual period was 10/12/2003 (approximate).          Sexually active: No.  The current method of family planning is post menopausal status.    Exercising: Yes.     Walking 3 miles 3x a week  Smoker:  no  Health Maintenance: Pap: 02-04-18 negative, HR HPV negative History of abnormal Pap:  no h/o dysplasia.  MMG:  07/05/21 density B Bi-rads 1 neg  BMD:   07/05/21 osteopenic/ low bone mass,   T score -2.2, FRAX 15.6/2% Colonoscopy: 04-18-20, polyps noted,  f/u 3 years  TDaP:  11/30/2010 Gardasil: no   reports that she has never smoked. She has never used smokeless tobacco. She reports current alcohol use of about 4.0 standard drinks of alcohol per week. She reports that she does not use drugs. 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 2 grown daughters. Oldest is in [place], Electrical engineer. Betsy Coder just graduated from college and is living in [place].   Past Medical History:  Diagnosis Date   Abnormal Pap smear of cervix    66 yrs old, just repeat done   Factor V Leiden ([place])    Hx of sessile serrated colonic polyps 04/27/2020   Multiple thyroid nodules    Osteopenia 12/2007   PONV (postoperative nausea and vomiting)    Protein S deficiency ([place])    Thyroid disease    hypothyroid    Past Surgical History:  Procedure Laterality Date   BREAST CYST ASPIRATION Right    BREAST EXCISIONAL BIOPSY Right 08/09/2020   fibroadenoma   BREAST LUMPECTOMY WITH RADIOACTIVE SEED LOCALIZATION Right 08/09/2020   Procedure: RIGHT BREAST LUMPECTOMY WITH RADIOACTIVE SEED LOCALIZATION;  Surgeon: Donnie Mesa, MD;  Location: [place];  Service: General;  Laterality: Right;   REFRACTIVE SURGERY Right 10/2013   Lasix   SKIN CANCER EXCISION Left 10/2012    Left clavicle - squamous cell   TONSILECTOMY, ADENOIDECTOMY, BILATERAL MYRINGOTOMY AND TUBES     at age 46    VAGINAL DELIVERY     x2    Current Outpatient Medications  Medication Sig Dispense Refill   betamethasone dipropionate 0.05 % cream Apply topically 2 (two) times daily.     CALCIUM PO Take by mouth.     CINNAMON PO Take by mouth.     CRANBERRY PO Take by mouth.     Cyanocobalamin (VITAMIN B-12 PO) Take by mouth.     ergocalciferol (VITAMIN D2) 1.25 MG (50000 UT) capsule Take 50,000 Units by mouth once a week.     glucosamine-chondroitin 500-400 MG tablet Take 1 tablet by mouth 3 (three) times daily.     levothyroxine (SYNTHROID) 75 MCG tablet TAKE 1 TABLET IN THE MORNING BEFORE BREAKFAST. 30 tablet 12   Multiple Vitamin (MULTIVITAMIN) tablet Take 1 tablet by mouth daily.     Probiotic Product (PRO-BIOTIC BLEND PO) Take by mouth.     No current facility-administered medications for this visit.    Family History  Adopted: Yes  Problem Relation Age of Onset   Heart disease Father    Stroke Father     Review of Systems  All other systems reviewed and are negative.   Exam:   BP 118/74   Pulse 92  Ht '5\' 5"'$  (1.651 m)   Wt 139 lb 9.6 oz (63.3 kg)   LMP 10/12/2003 (Approximate)   SpO2 97%   BMI 23.23 kg/m   Weight change: '@WEIGHTCHANGE'$ @ Height:   Height: '5\' 5"'$  (165.1 cm)  Ht Readings from Last 3 Encounters:  03/07/22 '5\' 5"'$  (1.651 m)  02/22/21 '5\' 5"'$  (1.651 m)  08/09/20 '5\' 5"'$  (1.651 m)    General appearance: alert, cooperative and appears stated age Head: Normocephalic, without obvious abnormality, atraumatic Neck: no adenopathy, supple, symmetrical, trachea midline and thyroid normal to inspection and palpation Lungs: clear to auscultation bilaterally Cardiovascular: regular rate and rhythm Breasts: normal appearance, no masses or tenderness Abdomen: soft, non-tender; non distended,  no masses,  no organomegaly Extremities: extremities normal, atraumatic, no  cyanosis or edema Skin: Skin color, texture, turgor normal. No rashes or lesions Lymph nodes: Cervical, supraclavicular, and axillary nodes normal. No abnormal inguinal nodes palpated Neurologic: Grossly normal   Pelvic: External genitalia:  no lesions              Urethra:  normal appearing urethra with no masses, tenderness or lesions              Bartholins and Skenes: normal                 Vagina: mildly atrophic appearing vagina with normal color and discharge, no lesions              Cervix: no lesions               Bimanual Exam:  Uterus:  normal size, contour, position, consistency, mobility, non-tender              Adnexa: no mass, fullness, tenderness               Rectovaginal: Confirms               Anus:  normal sphincter tone, no lesions  Gae Dry, CMA chaperoned for the exam.  1. Well woman exam Pap next year Mammogram in 2/24 Colonoscopy next year Labs with primary Discussed breast self exam  2. History of osteopenia Discussed calcium and vit D intake Discussed exercise Next DEXA in 2/25

## 2022-03-07 ENCOUNTER — Ambulatory Visit (INDEPENDENT_AMBULATORY_CARE_PROVIDER_SITE_OTHER): Payer: BC Managed Care – PPO | Admitting: Obstetrics and Gynecology

## 2022-03-07 ENCOUNTER — Encounter: Payer: Self-pay | Admitting: Obstetrics and Gynecology

## 2022-03-07 VITALS — BP 118/74 | HR 92 | Ht 65.0 in | Wt 139.6 lb

## 2022-03-07 DIAGNOSIS — Z8739 Personal history of other diseases of the musculoskeletal system and connective tissue: Secondary | ICD-10-CM

## 2022-03-07 DIAGNOSIS — Z01419 Encounter for gynecological examination (general) (routine) without abnormal findings: Secondary | ICD-10-CM

## 2022-03-12 ENCOUNTER — Other Ambulatory Visit: Payer: Self-pay | Admitting: Family Medicine

## 2022-03-12 DIAGNOSIS — E041 Nontoxic single thyroid nodule: Secondary | ICD-10-CM

## 2022-03-18 ENCOUNTER — Ambulatory Visit
Admission: RE | Admit: 2022-03-18 | Discharge: 2022-03-18 | Disposition: A | Discharge status: Home / Self Care | Payer: BC Managed Care – PPO | Source: Ambulatory Visit | Attending: Family Medicine | Admitting: Family Medicine

## 2022-03-18 DIAGNOSIS — E041 Nontoxic single thyroid nodule: Secondary | ICD-10-CM

## 2022-07-16 ENCOUNTER — Other Ambulatory Visit: Payer: Self-pay | Admitting: Obstetrics and Gynecology

## 2022-07-16 DIAGNOSIS — Z1231 Encounter for screening mammogram for malignant neoplasm of breast: Secondary | ICD-10-CM

## 2022-08-26 ENCOUNTER — Ambulatory Visit
Admission: RE | Admit: 2022-08-26 | Discharge: 2022-08-26 | Disposition: A | Discharge status: Home / Self Care | Payer: BC Managed Care – PPO | Source: Ambulatory Visit | Attending: Obstetrics and Gynecology | Admitting: Obstetrics and Gynecology

## 2022-08-26 DIAGNOSIS — Z1231 Encounter for screening mammogram for malignant neoplasm of breast: Secondary | ICD-10-CM

## 2023-03-11 ENCOUNTER — Ambulatory Visit: Payer: BC Managed Care – PPO | Admitting: Obstetrics and Gynecology

## 2023-04-02 ENCOUNTER — Encounter: Payer: Self-pay | Admitting: Internal Medicine

## 2023-06-05 ENCOUNTER — Encounter: Payer: Self-pay | Admitting: Internal Medicine

## 2023-07-09 ENCOUNTER — Ambulatory Visit (AMBULATORY_SURGERY_CENTER): Payer: Medicare Other

## 2023-07-09 VITALS — Ht 65.0 in | Wt 140.0 lb

## 2023-07-09 DIAGNOSIS — Z860100 Personal history of colon polyps, unspecified: Secondary | ICD-10-CM

## 2023-07-09 MED ORDER — SUFLAVE 178.7 G PO SOLR: 1.0000 | Freq: Once | ORAL | 0 refills | Status: AC

## 2023-07-09 NOTE — Progress Notes (Signed)
 No egg or soy allergy known to patient  No issues known to pt with past sedation with any surgeries or procedures Patient denies ever being told they had issues or difficulty with intubation  No FH of Malignant Hyperthermia Pt is not on diet pills Pt is not on  home 02  Pt is not on blood thinners  Pt denies issues with constipation  No A fib or A flutter Have any cardiac testing pending--no Pt can ambulate independently Pt denies use of chewing tobacco Discussed diabetic I weight loss medication holds Discussed NSAID holds Checked BMI Pt instructed to use Singlecare.com or GoodRx for a price reduction on prep  Patient's chart reviewed by Brandy Ortiz CNRA prior to previsit and patient appropriate for the [facility].  Pre visit completed and red dot placed by patient's name on their procedure day (on provider's schedule).

## 2023-07-21 ENCOUNTER — Encounter: Payer: Self-pay | Admitting: Certified Registered Nurse Anesthetist

## 2023-07-23 ENCOUNTER — Ambulatory Visit: Payer: Self-pay | Admitting: Internal Medicine

## 2023-07-23 ENCOUNTER — Encounter: Payer: Self-pay | Admitting: Internal Medicine

## 2023-07-23 VITALS — BP 118/68 | HR 66 | Temp 97.4°F | Resp 11 | Ht 65.0 in | Wt 140.0 lb

## 2023-07-23 DIAGNOSIS — K5730 Diverticulosis of large intestine without perforation or abscess without bleeding: Secondary | ICD-10-CM | POA: Diagnosis not present

## 2023-07-23 DIAGNOSIS — Z1211 Encounter for screening for malignant neoplasm of colon: Secondary | ICD-10-CM | POA: Diagnosis not present

## 2023-07-23 DIAGNOSIS — K6289 Other specified diseases of anus and rectum: Secondary | ICD-10-CM | POA: Diagnosis not present

## 2023-07-23 DIAGNOSIS — Z860101 Personal history of adenomatous and serrated colon polyps: Secondary | ICD-10-CM

## 2023-07-23 DIAGNOSIS — D123 Benign neoplasm of transverse colon: Secondary | ICD-10-CM | POA: Diagnosis not present

## 2023-07-23 DIAGNOSIS — Z860100 Personal history of colon polyps, unspecified: Secondary | ICD-10-CM

## 2023-07-23 MED ORDER — SODIUM CHLORIDE 0.9 % IV SOLN: 500.0000 mL | Freq: Once | INTRAVENOUS | Status: DC

## 2023-07-23 NOTE — Progress Notes (Signed)
 Pt's states no medical or surgical changes since previsit or office visit.

## 2023-07-23 NOTE — Progress Notes (Signed)
 [facility] History and Physical   Primary Care Physician:  Dois Davenport, MD   Reason for Procedure:    Encounter Diagnosis  Name Primary?   History of colonic polyps Yes     Plan:    colonoscopy     HPI: Brandy Ortiz is a 68 y.o. female here for surveillance exam after 2 ssp's removed 2021 - max 10 mm   Past Medical History:  Diagnosis Date   Abnormal Pap smear of cervix    68 yrs old, just repeat done   Allergy    Clotting disorder (HCC)    Factor V Leiden (HCC)    Hx of sessile serrated colonic polyps 04/27/2020   Multiple thyroid nodules    Osteopenia 12/2007   PONV (postoperative nausea and vomiting)    Protein S deficiency (HCC)    Thyroid disease    hypothyroid    Past Surgical History:  Procedure Laterality Date   BREAST CYST ASPIRATION Right    BREAST EXCISIONAL BIOPSY Right 08/09/2020   fibroadenoma   BREAST LUMPECTOMY WITH RADIOACTIVE SEED LOCALIZATION Right 08/09/2020   Procedure: RIGHT BREAST LUMPECTOMY WITH RADIOACTIVE SEED LOCALIZATION;  Surgeon: Manus Rudd, MD;  Location: [facility];  Service: General;  Laterality: Right;   REFRACTIVE SURGERY Right 10/2013   Lasix   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 Authorizing Provider  cholecalciferol (VITAMIN D3) 25 MCG (1000 UNIT) tablet Take by mouth. 06/21/16  Yes [provider]  CINNAMON PO Take by mouth.   Yes [provider]  CRANBERRY PO Take by mouth.   Yes [provider]  Cyanocobalamin (VITAMIN B-12 PO) Take by mouth.   Yes [provider]  ergocalciferol (VITAMIN D2) 1.25 MG (50000 UT) capsule Take 50,000 Units by mouth once a week.   Yes [provider]  levothyroxine (SYNTHROID) 75 MCG tablet  TAKE 1 TABLET IN THE MORNING BEFORE BREAKFAST. 02/10/19  Yes Verner Chol, CNM  Multiple Vitamin (MULTIVITAMIN) tablet Take 1 tablet by mouth daily.   Yes [provider]  Probiotic Product (PRO-BIOTIC BLEND PO) Take by mouth.   Yes [provider]  fluticasone Aleda Grana ALLERGY RELIEF) 50 MCG/ACT nasal spray  06/21/16   [provider]  glucosamine-chondroitin 500-400 MG tablet Take 1 tablet by mouth 3 (three) times daily.    [provider]  ibuprofen (ADVIL) 100 MG/5ML suspension Take 200 mg by mouth every 4 (four) hours as needed.    [provider]    Current Outpatient Medications  Medication Sig Dispense Refill   cholecalciferol (VITAMIN D3) 25 MCG (1000 UNIT) tablet Take by mouth.     CINNAMON PO Take by mouth.     CRANBERRY PO Take by mouth.     Cyanocobalamin (VITAMIN B-12 PO) Take by mouth.     ergocalciferol (VITAMIN D2) 1.25 MG (50000 UT) capsule Take 50,000 Units by mouth once a week.     levothyroxine (SYNTHROID) 75 MCG tablet TAKE 1 TABLET IN THE MORNING BEFORE BREAKFAST. 30 tablet 12   Multiple Vitamin (MULTIVITAMIN) tablet Take 1 tablet by mouth daily.     Probiotic Product (PRO-BIOTIC BLEND PO) Take by mouth.     fluticasone (FLONASE ALLERGY RELIEF)  50 MCG/ACT nasal spray      glucosamine-chondroitin 500-400 MG tablet Take 1 tablet by mouth 3 (three) times daily.     ibuprofen (ADVIL) 100 MG/5ML suspension Take 200 mg by mouth every 4 (four) hours as needed.     Current Facility-Administered Medications  Medication Dose Route Frequency Provider Last Rate Last Admin   0.9 %  sodium chloride infusion  500 mL Intravenous Once Iva Boop, MD        Allergies as of 07/23/2023 - Review Complete 07/23/2023  Allergen Reaction Noted   Sulfa antibiotics Hives 12/04/2012   Adhesive [tape] Other (See Comments) 12/04/2012    Family History  Adopted: Yes  Problem Relation Age of Onset   Heart disease Father    Stroke Father      Social History   Socioeconomic History   Marital status: Married    Spouse name: Not on file   Number of children: Not on file   Years of education: Not on file   Highest education level: Not on file  Occupational History   Not on file  Tobacco Use   Smoking status: Never   Smokeless tobacco: Never  Vaping Use   Vaping status: Never Used  Substance and Sexual Activity   Alcohol use: Yes    Alcohol/week: 4.0 standard drinks of alcohol    Types: 4 Standard drinks or equivalent per week   Drug use: No   Sexual activity: Yes    Partners: Male    Birth control/protection: Post-menopausal  Other Topics Concern   Not on file  Social History Narrative   Not on file   Social Drivers of Health   Financial Resource Strain: Not on file  Food Insecurity: Not on file  Transportation Needs: Not on file  Physical Activity: Not on file  Stress: Not on file  Social Connections: Not on file  Intimate Partner Violence: Not on file    Review of Systems:  All other review of systems negative except as mentioned in the HPI.  Physical Exam: Vital signs BP 132/78   Pulse (!) 102   Temp (!) 97.4 F (36.3 C) (Temporal)   Ht 5\' 5"  (1.651 m)   Wt 140 lb (63.5 kg)   LMP 10/12/2003 (Approximate)   SpO2 98%   BMI 23.30 kg/m   General:   Alert,  Well-developed, well-nourished, pleasant and cooperative in NAD Lungs:  Clear throughout to auscultation.   Heart:  Regular rate and rhythm; no murmurs, clicks, rubs,  or gallops. Abdomen:  Soft, nontender and nondistended. Normal bowel sounds.   Neuro/Psych:  Alert and cooperative. Normal mood and affect. A and O x 3   @Shayonna Ocampo  Sena Slate, MD, [facility] [phone] (pager) 07/23/2023 11:06 AM@

## 2023-07-23 NOTE — Op Note (Signed)
 [facility] Patient Name: Brandy Ortiz Procedure Date: 07/23/2023 11:08 AM MRN: [id number] Endoscopist: Iva Boop , MD, [id number] Age: 68 Referring MD:  Date of Birth: 16-Nov-1955 Gender: Female Account #: [id number] Procedure:                Colonoscopy Indications:              High risk colon cancer surveillance: Personal                            history of sessile serrated colon polyp (10 mm or                            greater in size), Last colonoscopy: December 2021 Medicines:                Monitored Anesthesia Care Procedure:                Pre-Anesthesia Assessment:                           - Prior to the procedure, a History and Physical                            was performed, and patient medications and                            allergies were reviewed. The patient's tolerance of                            previous anesthesia was also reviewed. The risks                            and benefits of the procedure and the sedation                            options and risks were discussed with the patient.                            All questions were answered, and informed consent                            was obtained. Prior Anticoagulants: The patient has                            taken no anticoagulant or antiplatelet agents. ASA                            Grade Assessment: II - A patient with mild systemic                            disease. After reviewing the risks and benefits,                            the patient was deemed in satisfactory condition to  undergo the procedure.                           After obtaining informed consent, the colonoscope                            was passed under direct vision. Throughout the                            procedure, the patient's blood pressure, pulse, and                            oxygen saturations were monitored continuously. The                            Olympus  Scope SN [id number] was introduced through the                            anus and advanced to the the cecum, identified by                            appendiceal orifice and ileocecal valve. The                            colonoscopy was performed without difficulty. The                            patient tolerated the procedure well. The quality                            of the bowel preparation was good. The ileocecal                            valve, appendiceal orifice, and rectum were                            photographed. The bowel preparation used was                            SUFLAVE via split dose instruction. Scope In: 11:17:08 AM Scope Out: 11:32:03 AM Scope Withdrawal Time: 0 hours 10 minutes 21 seconds  Total Procedure Duration: 0 hours 14 minutes 55 seconds  Findings:                 The perianal and digital rectal examinations were                            normal.                           A 4 mm polyp was found in the transverse colon. The                            polyp was sessile. The polyp was removed with a  cold snare. Resection and retrieval were complete.                            Verification of patient identification for the                            specimen was done. Estimated blood loss was minimal.                           Scattered small-mouthed diverticula were found in                            the transverse colon and ascending colon.                           Anal papilla(e) were hypertrophied.                           The exam was otherwise without abnormality on                            direct and retroflexion views. Complications:            No immediate complications. Estimated Blood Loss:     Estimated blood loss was minimal. Impression:               - One 4 mm polyp in the transverse colon, removed                            with a cold snare. Resected and retrieved.                           - Diverticulosis  in the transverse colon and in the                            ascending colon.                           - Anal papilla(e) were hypertrophied.                           - The examination was otherwise normal on direct                            and retroflexion views.                           - Personal history of colonic polyps. 2 sessile                            serrated polyps removed 12/21 - max 10 mm Recommendation:           - Patient has a contact number available for                            emergencies. The signs and symptoms  of potential                            delayed complications were discussed with the                            patient. Return to normal activities tomorrow.                            Written discharge instructions were provided to the                            patient.                           - Resume previous diet.                           - Continue present medications.                           - Repeat colonoscopy is recommended for                            surveillance. The colonoscopy date will be                            determined after pathology results from today's                            exam become available for review. Iva Boop, MD 07/23/2023 11:39:00 AM This report has been signed electronically.

## 2023-07-23 NOTE — Progress Notes (Signed)
 Called to room to assist during endoscopic procedure.  Patient ID and intended procedure confirmed with present staff. Received instructions for my participation in the procedure from the performing physician.

## 2023-07-23 NOTE — Patient Instructions (Addendum)
 Please read handouts provided. Continue present medications. Resume previous diet.   YOU HAD AN ENDOSCOPIC PROCEDURE TODAY AT THE [facility]:   Refer to the procedure report that was given to you for any specific questions about what was found during the examination.  If the procedure report does not answer your questions, please call your gastroenterologist to clarify.  If you requested that your care partner not be given the details of your procedure findings, then the procedure report has been included in a sealed envelope for you to review at your convenience later.  YOU SHOULD EXPECT: Some feelings of bloating in the abdomen. Passage of more gas than usual.  Walking can help get rid of the air that was put into your GI tract during the procedure and reduce the bloating. If you had a lower endoscopy (such as a colonoscopy or flexible sigmoidoscopy) you may notice spotting of blood in your stool or on the toilet paper. If you underwent a bowel prep for your procedure, you may not have a normal bowel movement for a few days.  Please Note:  You might notice some irritation and congestion in your nose or some drainage.  This is from the oxygen used during your procedure.  There is no need for concern and it should clear up in a day or so.  SYMPTOMS TO REPORT IMMEDIATELY:  Following lower endoscopy (colonoscopy or flexible sigmoidoscopy):  Excessive amounts of blood in the stool  Significant tenderness or worsening of abdominal pains  Swelling of the abdomen that is new, acute  Fever of 100F or higher.  For urgent or emergent issues, a gastroenterologist can be reached at any hour by calling [phone]. Do not use MyChart messaging for urgent concerns.    DIET:  We do recommend a small meal at first, but then you may proceed to your regular diet.  Drink plenty of fluids but you should avoid alcoholic beverages for 24 hours.  ACTIVITY:  You should plan to take it easy for  the rest of today and you should NOT DRIVE or use heavy machinery until tomorrow (because of the sedation medicines used during the test).    FOLLOW UP: Our staff will call the number listed on your records the next business day following your procedure.  We will call around 7:15- 8:00 am to check on you and address any questions or concerns that you may have regarding the information given to you following your procedure. If we do not reach you, we will leave a message.     If any biopsies were taken you will be contacted by phone or by letter within the next 1-3 weeks.  Please call us at [phone] if you have not heard about the biopsies in 3 weeks.    SIGNATURES/CONFIDENTIALITY: You and/or your care partner have signed paperwork which will be entered into your electronic medical record.  These signatures attest to the fact that that the information above on your After Visit Summary has been reviewed and is understood.  Full responsibility of the confidentiality of this discharge information lies with you and/or your care-partner.One tiny polyp found and removed.  I will let you know pathology results and when to have another routine colonoscopy by mail and/or My Chart.  You also have a condition called diverticulosis - common and not usually a problem. Please read the handout provided.  I appreciate the opportunity to care for you. Iva Boop, MD, [facility]

## 2023-07-23 NOTE — Progress Notes (Signed)
 Report given to PACU, vss

## 2023-07-24 ENCOUNTER — Telehealth: Payer: Self-pay | Admitting: *Deleted

## 2023-07-24 NOTE — Telephone Encounter (Signed)
  Follow up Call-     07/23/2023   10:24 AM  Call back number  Post procedure Call Back phone  # [phone]  Permission to leave phone message Yes   No answer, left message to call back if needed

## 2023-07-25 LAB — SURGICAL PATHOLOGY

## 2023-07-27 ENCOUNTER — Encounter: Payer: Self-pay | Admitting: Internal Medicine

## 2023-09-02 ENCOUNTER — Ambulatory Visit
Admission: RE | Admit: 2023-09-02 | Discharge: 2023-09-02 | Disposition: A | Discharge status: Home / Self Care | Source: Ambulatory Visit | Attending: Obstetrics and Gynecology | Admitting: Obstetrics and Gynecology

## 2023-09-02 ENCOUNTER — Other Ambulatory Visit: Payer: Self-pay | Admitting: Obstetrics and Gynecology

## 2023-09-02 DIAGNOSIS — Z1231 Encounter for screening mammogram for malignant neoplasm of breast: Secondary | ICD-10-CM

## 2023-10-24 IMAGING — MG MM DIGITAL SCREENING BILAT W/ TOMO AND CAD
8 series · 8 of 24 positions shown · non-contrast
Comparison: Previous exam(s).

CLINICAL DATA: Screening.

EXAM:
DIGITAL SCREENING BILATERAL MAMMOGRAM WITH TOMOSYNTHESIS AND CAD
TECHNIQUE: Bilateral screening digital craniocaudal and mediolateral oblique
mammograms were obtained. Bilateral screening digital breast
tomosynthesis was performed. The images were evaluated with
computer-aided detection.

[L MLO synth-2D]
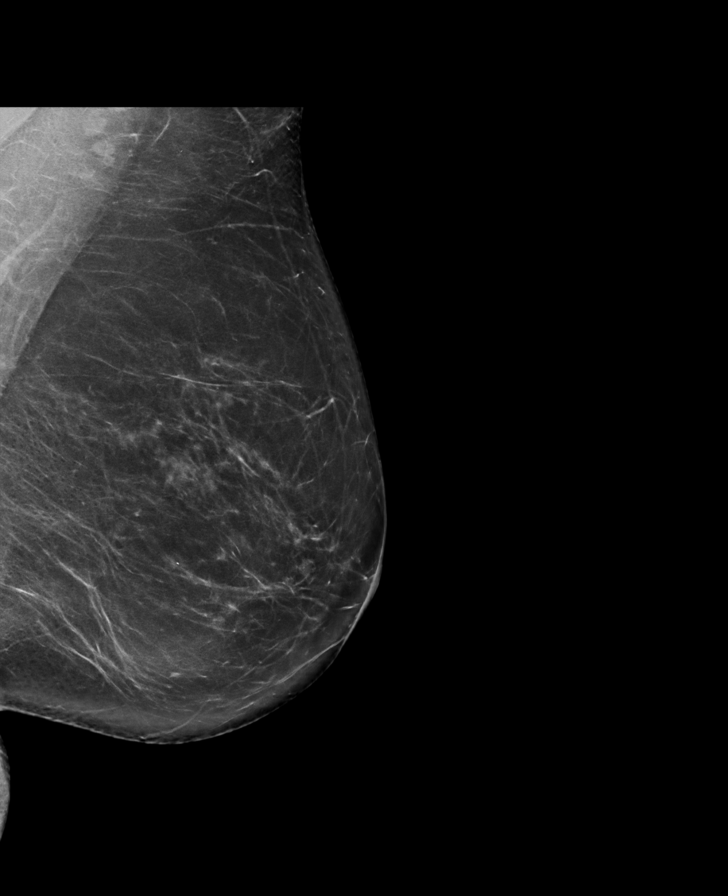

[R CC synth-2D]
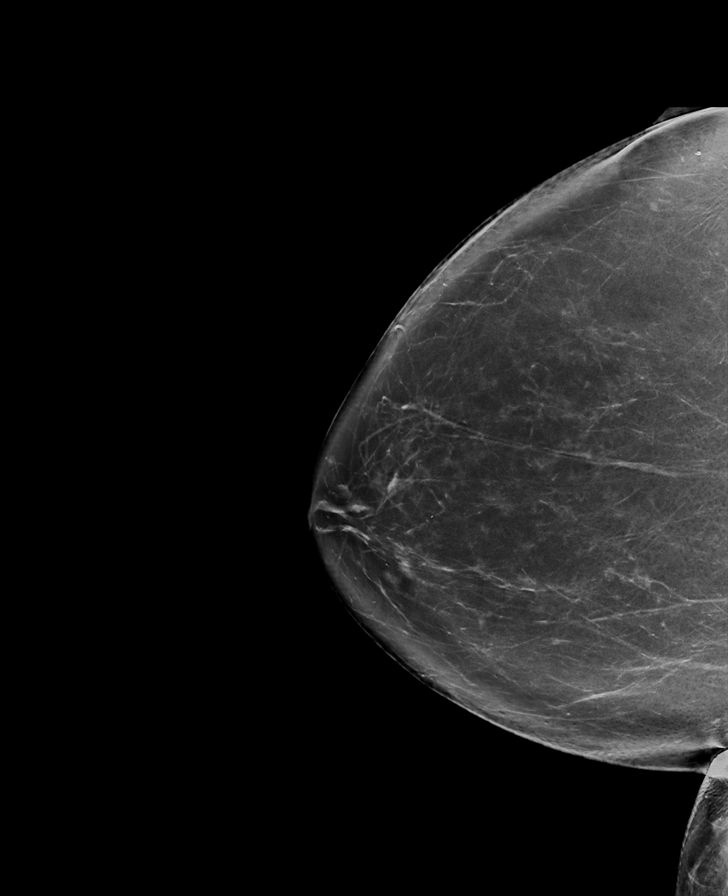

[R MLO synth-2D]
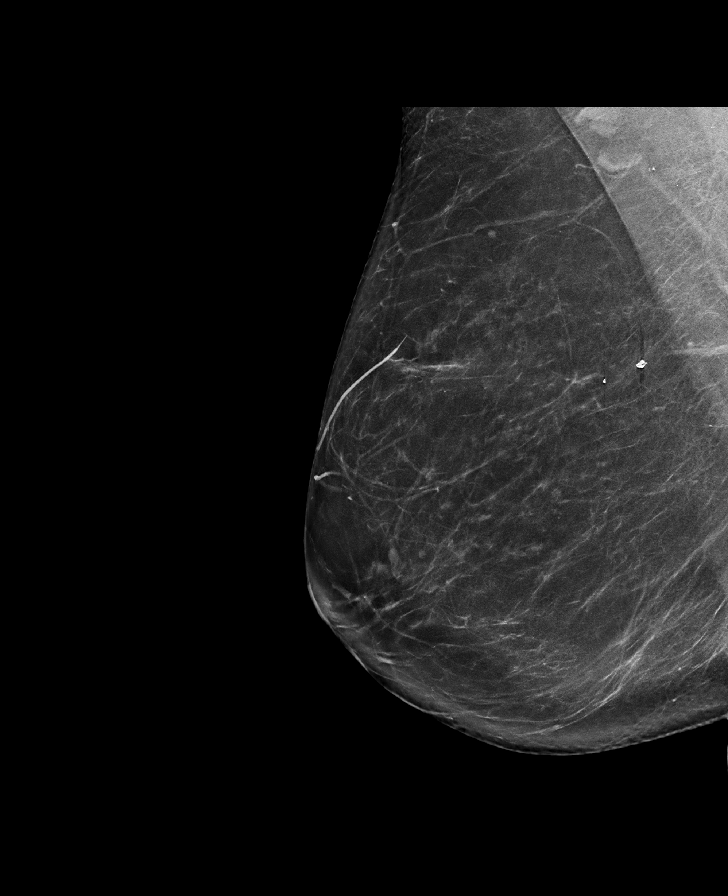

[L CC synth-2D]
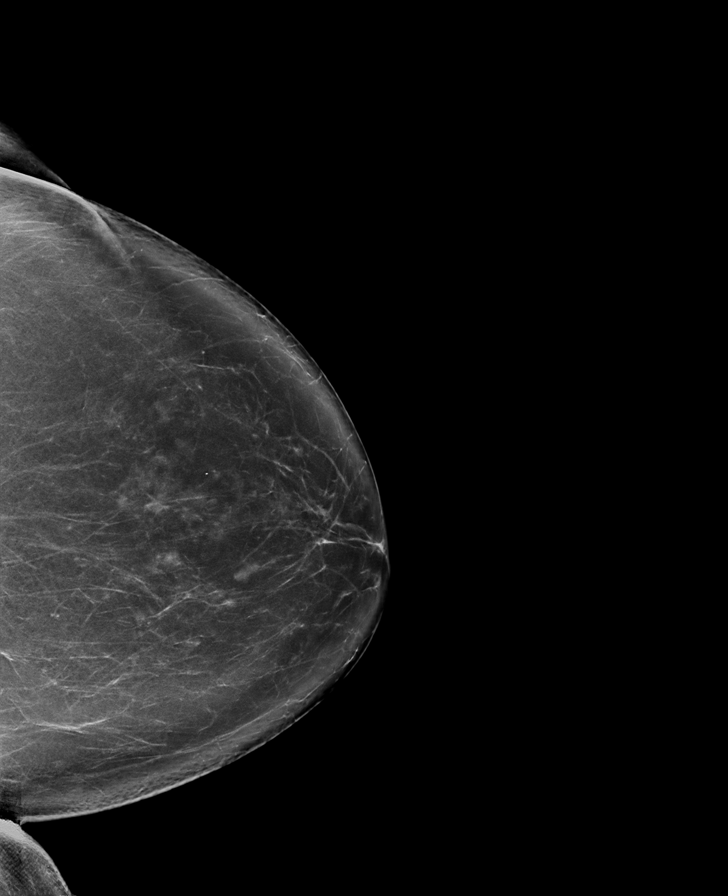

[R MLO tomo · tomo slice 42/83.0]
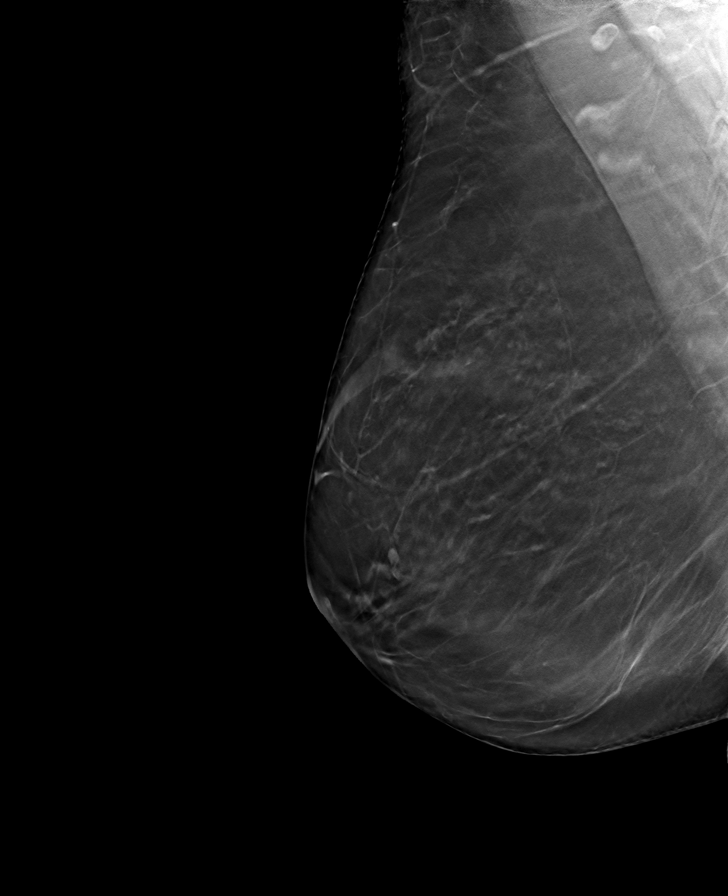

[L CC tomo · tomo slice 45/89.0]
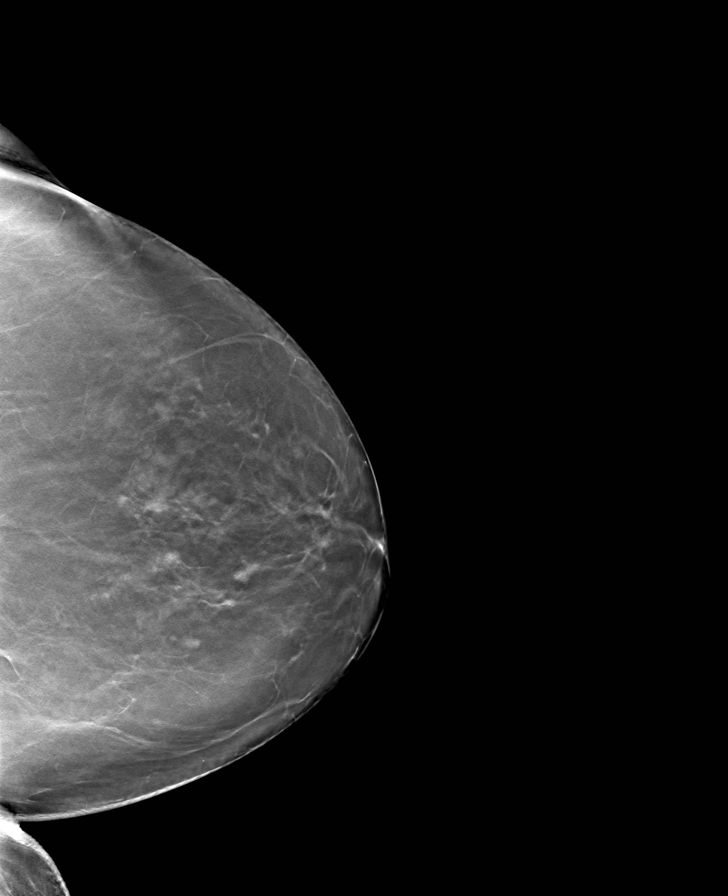

[R CC tomo · tomo slice 45/89.0]
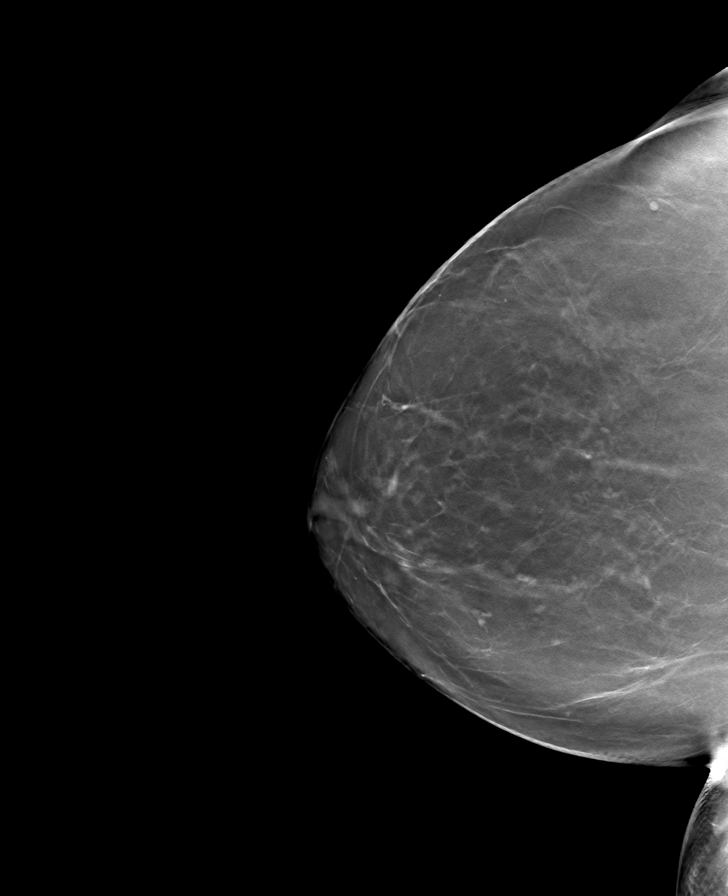

[L MLO tomo · tomo slice 43/86.0]
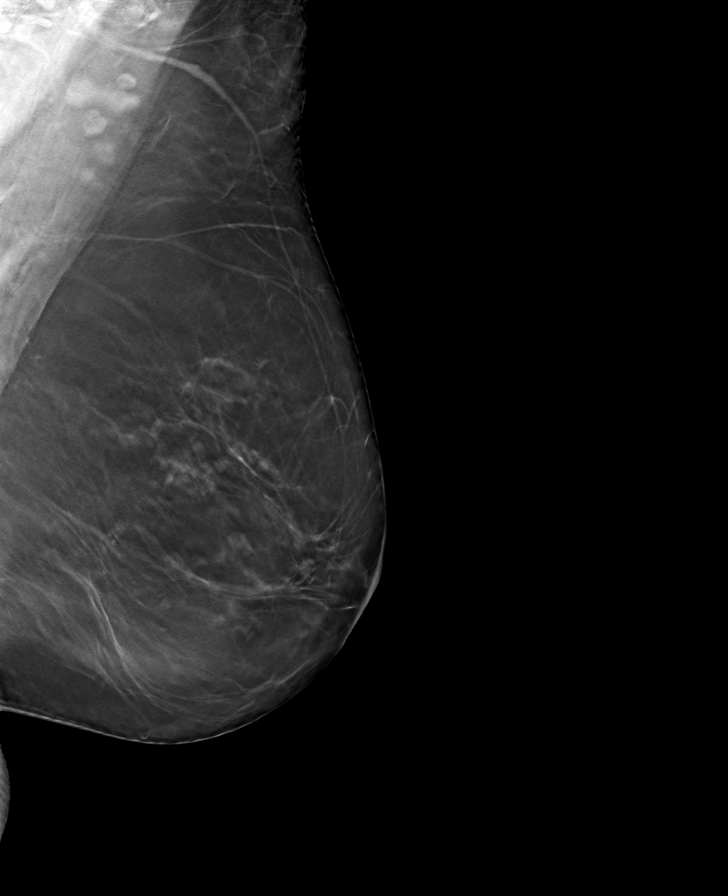

[8 of 24 positions shown; findings below may reference images not displayed]

ACR Breast Density Category b: There are scattered areas of
fibroglandular density.
FINDINGS: There are no findings suspicious for malignancy.
IMPRESSION: No mammographic evidence of malignancy. A result letter of this
screening mammogram will be mailed directly to the patient.

RECOMMENDATION:
Screening mammogram in one year. (Code:51-O-LD2)

BI-RADS CATEGORY  1: Negative.

## 2023-11-05 ENCOUNTER — Other Ambulatory Visit: Payer: Self-pay | Admitting: Family Medicine

## 2023-11-05 ENCOUNTER — Ambulatory Visit
Admission: RE | Admit: 2023-11-05 | Discharge: 2023-11-05 | Disposition: A | Discharge status: Home / Self Care | Source: Ambulatory Visit | Attending: Family Medicine | Admitting: Family Medicine

## 2023-11-05 DIAGNOSIS — E041 Nontoxic single thyroid nodule: Secondary | ICD-10-CM

## 2024-03-30 ENCOUNTER — Other Ambulatory Visit: Payer: Self-pay | Admitting: Orthopedic Surgery

## 2024-04-12 ENCOUNTER — Encounter (HOSPITAL_BASED_OUTPATIENT_CLINIC_OR_DEPARTMENT_OTHER): Payer: Self-pay | Admitting: Orthopedic Surgery

## 2024-04-12 ENCOUNTER — Other Ambulatory Visit: Payer: Self-pay

## 2024-04-14 ENCOUNTER — Encounter (HOSPITAL_BASED_OUTPATIENT_CLINIC_OR_DEPARTMENT_OTHER)
Admission: RE | Admit: 2024-04-14 | Discharge: 2024-04-14 | Disposition: A | Source: Ambulatory Visit | Attending: Orthopedic Surgery | Admitting: Orthopedic Surgery

## 2024-04-14 LAB — BASIC METABOLIC PANEL WITH GFR
Anion gap: 11 (ref 5–15)
BUN: 15 mg/dL (ref 8–23)
CO2: 25 mmol/L (ref 22–32)
Calcium: 9.6 mg/dL (ref 8.9–10.3)
Chloride: 103 mmol/L (ref 98–111)
Creatinine, Ser: 0.73 mg/dL (ref 0.44–1.00)
GFR, Estimated: >60 mL/min (ref >60)
Glucose, Bld: 91 mg/dL (ref 70–99)
Potassium: 4.1 mmol/L (ref 3.5–5.1)
Sodium: 139 mmol/L (ref 135–145)

## 2024-04-14 NOTE — Progress Notes (Signed)

## 2024-04-19 ENCOUNTER — Encounter (HOSPITAL_BASED_OUTPATIENT_CLINIC_OR_DEPARTMENT_OTHER): Admission: RE | Disposition: A | Payer: Self-pay | Source: Home / Self Care | Attending: Orthopedic Surgery

## 2024-04-19 ENCOUNTER — Encounter (HOSPITAL_BASED_OUTPATIENT_CLINIC_OR_DEPARTMENT_OTHER): Payer: Self-pay | Admitting: Orthopedic Surgery

## 2024-04-19 ENCOUNTER — Ambulatory Visit (HOSPITAL_BASED_OUTPATIENT_CLINIC_OR_DEPARTMENT_OTHER): Admitting: Anesthesiology

## 2024-04-19 ENCOUNTER — Other Ambulatory Visit: Payer: Self-pay

## 2024-04-19 ENCOUNTER — Ambulatory Visit (HOSPITAL_BASED_OUTPATIENT_CLINIC_OR_DEPARTMENT_OTHER)
Admission: RE | Admit: 2024-04-19 | Discharge: 2024-04-19 | Disposition: A | Attending: Orthopedic Surgery | Admitting: Orthopedic Surgery

## 2024-04-19 DIAGNOSIS — M67442 Ganglion, left hand: Secondary | ICD-10-CM | POA: Diagnosis present

## 2024-04-19 DIAGNOSIS — Z01818 Encounter for other preprocedural examination: Secondary | ICD-10-CM

## 2024-04-19 HISTORY — PX: ARTHROTOMY: SHX5728

## 2024-04-19 HISTORY — PX: CYST REMOVAL HAND: SHX6279

## 2024-04-19 SURGERY — REMOVAL, CYST, HAND
Anesthesia: Regional | Site: Middle Finger | Laterality: Left

## 2024-04-19 MED ORDER — DEXAMETHASONE SODIUM PHOSPHATE 4 MG/ML IJ SOLN
INTRAMUSCULAR | Status: DC | PRN
  Administered 2024-04-19: 5 mg via INTRAVENOUS

## 2024-04-19 MED ORDER — LIDOCAINE HCL (CARDIAC) PF 100 MG/5ML IV SOSY
PREFILLED_SYRINGE | INTRAVENOUS | Status: DC | PRN
  Administered 2024-04-19: 60 mg via INTRAVENOUS

## 2024-04-19 MED ORDER — FENTANYL CITRATE (PF) 100 MCG/2ML IJ SOLN
INTRAMUSCULAR | Status: DC | PRN
  Administered 2024-04-19: 100 ug via INTRAVENOUS

## 2024-04-19 MED ORDER — LACTATED RINGERS IV SOLN: INTRAVENOUS | Status: DC

## 2024-04-19 MED ORDER — HYDROCODONE-ACETAMINOPHEN 5-325 MG PO TABS: 1.0000 | ORAL_TABLET | Freq: Four times a day (QID) | ORAL | 0 refills | Status: AC | PRN

## 2024-04-19 MED ORDER — ACETAMINOPHEN 10 MG/ML IV SOLN: 1000.0000 mg | Freq: Once | INTRAVENOUS | Status: DC | PRN

## 2024-04-19 MED ORDER — FENTANYL CITRATE (PF) 100 MCG/2ML IJ SOLN
INTRAMUSCULAR | Status: AC
  Filled 2024-04-19: qty 2

## 2024-04-19 MED ORDER — ACETAMINOPHEN 500 MG PO TABS: 1000.0000 mg | ORAL_TABLET | Freq: Once | ORAL | Status: DC

## 2024-04-19 MED ORDER — EPHEDRINE 5 MG/ML INJ
INTRAVENOUS | Status: AC
  Filled 2024-04-19: qty 5

## 2024-04-19 MED ORDER — MIDAZOLAM HCL 5 MG/5ML IJ SOLN
INTRAMUSCULAR | Status: DC | PRN
  Administered 2024-04-19: 2 mg via INTRAVENOUS

## 2024-04-19 MED ORDER — PROPOFOL 500 MG/50ML IV EMUL
INTRAVENOUS | Status: AC
  Filled 2024-04-19: qty 50

## 2024-04-19 MED ORDER — CEFAZOLIN SODIUM-DEXTROSE 2-4 GM/100ML-% IV SOLN
2.0000 g | INTRAVENOUS | Status: AC
  Administered 2024-04-19: 2 g via INTRAVENOUS

## 2024-04-19 MED ORDER — ONDANSETRON HCL 4 MG/2ML IJ SOLN
INTRAMUSCULAR | Status: AC
  Filled 2024-04-19: qty 2

## 2024-04-19 MED ORDER — OXYCODONE HCL 5 MG PO TABS: 5.0000 mg | ORAL_TABLET | Freq: Once | ORAL | Status: DC | PRN

## 2024-04-19 MED ORDER — PROPOFOL 10 MG/ML IV BOLUS
INTRAVENOUS | Status: DC | PRN
  Administered 2024-04-19: 150 mg via INTRAVENOUS
  Administered 2024-04-19: 50 mg via INTRAVENOUS

## 2024-04-19 MED ORDER — BUPIVACAINE HCL (PF) 0.25 % IJ SOLN
INTRAMUSCULAR | Status: DC | PRN
  Administered 2024-04-19: 9 mL

## 2024-04-19 MED ORDER — CEFAZOLIN SODIUM-DEXTROSE 2-4 GM/100ML-% IV SOLN
INTRAVENOUS | Status: AC
  Filled 2024-04-19: qty 100

## 2024-04-19 MED ORDER — ONDANSETRON HCL 4 MG/2ML IJ SOLN
INTRAMUSCULAR | Status: DC | PRN
  Administered 2024-04-19: 4 mg via INTRAVENOUS

## 2024-04-19 MED ORDER — FENTANYL CITRATE (PF) 100 MCG/2ML IJ SOLN: 25.0000 ug | INTRAMUSCULAR | Status: DC | PRN

## 2024-04-19 MED ORDER — OXYCODONE HCL 5 MG/5ML PO SOLN: 5.0000 mg | Freq: Once | ORAL | Status: DC | PRN

## 2024-04-19 MED ORDER — DROPERIDOL 2.5 MG/ML IJ SOLN: 0.6250 mg | Freq: Once | INTRAMUSCULAR | Status: DC | PRN

## 2024-04-19 MED ORDER — MIDAZOLAM HCL 2 MG/2ML IJ SOLN
INTRAMUSCULAR | Status: AC
  Filled 2024-04-19: qty 2

## 2024-04-19 MED ORDER — EPHEDRINE SULFATE (PRESSORS) 25 MG/5ML IV SOSY
PREFILLED_SYRINGE | INTRAVENOUS | Status: DC | PRN
  Administered 2024-04-19 (×2): 10 mg via INTRAVENOUS

## 2024-04-19 MED ORDER — SCOPOLAMINE 1 MG/3DAYS TD PT72: 1.0000 | MEDICATED_PATCH | TRANSDERMAL | Status: DC

## 2024-04-19 MED ORDER — KETOROLAC TROMETHAMINE 30 MG/ML IJ SOLN
INTRAMUSCULAR | Status: DC | PRN
  Administered 2024-04-19: 30 mg via INTRAVENOUS

## 2024-04-19 MED ORDER — 0.9 % SODIUM CHLORIDE (POUR BTL) OPTIME
TOPICAL | Status: DC | PRN
  Administered 2024-04-19: 200 mL

## 2024-04-19 MED ORDER — PROPOFOL 500 MG/50ML IV EMUL
INTRAVENOUS | Status: DC | PRN
  Administered 2024-04-19: 25 ug/kg/min via INTRAVENOUS

## 2024-04-19 SURGICAL SUPPLY — 41 items
BANDAGE GAUZE 1X75IN STRL (MISCELLANEOUS) IMPLANT
BENZOIN TINCTURE PRP APPL 2/3 (GAUZE/BANDAGES/DRESSINGS) IMPLANT
BLADE MINI RND TIP GREEN BEAV (BLADE) IMPLANT
BLADE SURG 15 STRL LF DISP TIS (BLADE) ×2 IMPLANT
BNDG COHESIVE 1X5 TAN STRL LF (GAUZE/BANDAGES/DRESSINGS) IMPLANT
BNDG COHESIVE 2X5 TAN ST LF (GAUZE/BANDAGES/DRESSINGS) IMPLANT
BNDG COMPR ESMARK 4X3 LF (GAUZE/BANDAGES/DRESSINGS) IMPLANT
BNDG ELASTIC 2INX 5YD STR LF (GAUZE/BANDAGES/DRESSINGS) IMPLANT
BNDG ELASTIC 3INX 5YD STR LF (GAUZE/BANDAGES/DRESSINGS) IMPLANT
BNDG GAUZE DERMACEA FLUFF 4 (GAUZE/BANDAGES/DRESSINGS) IMPLANT
BNDG PLASTER X FAST 3X3 WHT LF (CAST SUPPLIES) IMPLANT
CHLORAPREP W/TINT 26 (MISCELLANEOUS) ×1 IMPLANT
CORD BIPOLAR FORCEPS 12FT (ELECTRODE) ×1 IMPLANT
COVER BACK TABLE 60X90IN (DRAPES) ×1 IMPLANT
COVER MAYO STAND STRL (DRAPES) ×1 IMPLANT
CUFF TOURN SGL QUICK 18X4 (TOURNIQUET CUFF) ×1 IMPLANT
DRAPE EXTREMITY T 121X128X90 (DISPOSABLE) ×1 IMPLANT
DRAPE SURG 17X23 STRL (DRAPES) ×1 IMPLANT
GAUZE SPONGE 4X4 12PLY STRL (GAUZE/BANDAGES/DRESSINGS) ×1 IMPLANT
GAUZE STRETCH 2X75IN STRL (MISCELLANEOUS) IMPLANT
GAUZE XEROFORM 1X8 LF (GAUZE/BANDAGES/DRESSINGS) ×1 IMPLANT
GLOVE BIO SURGEON STRL SZ7 (GLOVE) IMPLANT
GLOVE BIO SURGEON STRL SZ7.5 (GLOVE) ×1 IMPLANT
GLOVE BIOGEL PI IND STRL 7.0 (GLOVE) IMPLANT
GLOVE BIOGEL PI IND STRL 8 (GLOVE) ×1 IMPLANT
GOWN STRL REUS W/ TWL LRG LVL3 (GOWN DISPOSABLE) ×1 IMPLANT
NDL HYPO 25X1 1.5 SAFETY (NEEDLE) ×1 IMPLANT
PACK BASIN DAY SURGERY FS (CUSTOM PROCEDURE TRAY) ×1 IMPLANT
PAD CAST 3X4 CTTN HI CHSV (CAST SUPPLIES) IMPLANT
PAD CAST 4YDX4 CTTN HI CHSV (CAST SUPPLIES) IMPLANT
PADDING CAST ABS COTTON 4X4 ST (CAST SUPPLIES) ×1 IMPLANT
SOLN 0.9% NACL POUR BTL 1000ML (IV SOLUTION) ×1 IMPLANT
SPLINT FNGR PLAIN END 5/8X3.25 (CAST SUPPLIES) IMPLANT
STOCKINETTE 4X48 STRL (DRAPES) ×1 IMPLANT
STRIP CLOSURE SKIN 1/2X4 (GAUZE/BANDAGES/DRESSINGS) IMPLANT
SUT ETHILON 3 0 PS 1 (SUTURE) IMPLANT
SUT ETHILON 4 0 PS 2 18 (SUTURE) ×1 IMPLANT
SYR BULB EAR ULCER 3OZ GRN STR (SYRINGE) ×1 IMPLANT
SYR CONTROL 10ML LL (SYRINGE) ×1 IMPLANT
TOWEL GREEN STERILE FF (TOWEL DISPOSABLE) ×2 IMPLANT
UNDERPAD 30X36 HEAVY ABSORB (UNDERPADS AND DIAPERS) ×1 IMPLANT

## 2024-04-19 NOTE — Op Note (Signed)
 SURGERY ASSISTANT NOTE  PLACE OF SERVICE: [facility]   PATIENT INFORMATION: Name: Brandy Ortiz MRN#: [id number] DOB: 1955-08-03  Date of surgery: 04/19/2024 Time of surgery: 3:53 PM  SURGERY ASSISTANT NOTE:  Assistant name: Isaiah Anton, PA-C Note date: 04/19/2024  I assisted Dr. Franky Curia on the following procedure(s) for the above-noted patient in the date and time documented:   Left long finger excision of mucoid cyst and debridement of DIP joint   I provided assistance on the case as follows:  Assistance with exposure, retraction, bleeding control, protection of vital structures, instrumentation  and closure.   Isaiah Anton, PA-C

## 2024-04-19 NOTE — Op Note (Signed)
 NAME: Brandy Ortiz MEDICAL RECORD NO: [id number] DATE OF BIRTH: [birth year] FACILITY: [facility] LOCATION: [facility] PHYSICIAN: Dessire Grimes R. 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  Left long finger excision mucoid cyst 2.  Left long finger debridement of DIP joint including bone from middle and distal phalanges   SURGEON:  Franky Curia, M.D.   ASSISTANT: Isaiah Anton, [facility]   ANESTHESIA:  General   INTRAVENOUS FLUIDS:  Per anesthesia flow sheet.   ESTIMATED BLOOD LOSS:  Minimal.   COMPLICATIONS:  None.   SPECIMENS: Left long finger mucoid cyst to pathology   TOURNIQUET TIME:    Total Tourniquet Time Documented: Upper Arm (Left) - 16 minutes Total: Upper Arm (Left) - 16 minutes    DISPOSITION:  Stable to PACU.   INDICATIONS: 68 year old female with left long finger mucoid cyst and DIP joint arthritis.  She wishes to proceed with excision of the mucoid cyst and debridement of the DIP joint to try to prevent recurrence.  Risks, benefits and alternatives of surgery were discussed including the risks of blood loss, infection, damage to nerves, vessels, tendons, ligaments, bone for surgery, need for additional surgery, complications with wound healing, continued pain, stiffness, , recurrence.  She voiced understanding of these risks and elected to proceed.  OPERATIVE COURSE:  After being identified preoperatively by myself,  the patient and I agreed on the procedure and site of the procedure.  The surgical site was marked.  Surgical consent had been signed. Preoperative IV antibiotic prophylaxis was given. She was transferred to the operating room and placed on the operating table in supine position with the left upper extremity on an arm board.  General anesthesia was  induced by the anesthesiologist.  Left upper extremity was prepped and draped in normal sterile orthopedic fashion.  A surgical pause was performed between the surgeons, anesthesia, and operating room staff and all were in agreement as to the patient, procedure, and site of procedure.  Tourniquet at the proximal aspect of the extremity was inflated to 250 mmHg after exsanguination of the arm with an Esmarch bandage.  A hockey-stick shaped incision was made at the level of the DIP joint.  This was carried into subcutaneous tissues by spreading technique.  The cyst was identified.  It was cleared of soft tissue attachments.  It was filled with clear gelatinous fluid.  The cyst lining was removed with the curette and synovectomy rongeurs and sent to pathology for examination.  The joint was entered underneath the extensor tendon.  The joint was then debrided of any further cystic tissue and then the synovectomy rongeurs and curette used to remove prominent bone from the dorsum of the middle phalanx and the ulnar side of the distal phalanx.  The wound and joint were copiously irrigated with sterile saline.  Wound was closed with 4-0 nylon in a horizontal mattress fashion.  It was dressed with sterile Xeroform 4 x 4 and wrapped with a Coban dressing lightly.  AlumaFoam splint was placed and wrapped lightly with Coban dressing.  A digital block was performed with quarter percent plain Marcaine  to aid in postoperative analgesia.  The tourniquet was deflated at 16 minutes.  Fingertips were pink with brisk capillary refill after deflation of tourniquet.  The operative  drapes were broken  down.  The patient was awoken from anesthesia safely.  She was transferred back to the stretcher and taken to PACU in stable condition.  I will see her back in the office in 1 week for postoperative followup.  I will give her a prescription for Norco 5/325 1 tab PO q6 hours prn pain, dispense #15.   Arnecia Ector, MD Electronically signed,  04/19/24

## 2024-04-19 NOTE — H&P (Signed)
 Brandy Ortiz is an 68 y.o. female.   Chief Complaint: mucoid cyst HPI: 68 yo female with left long finger mucoid cyst.  This is bothersome to her.  She wishes to have it removed and the dip joint debrided to try to prevent recurrence.    Allergies:  Allergies  Allergen Reactions   Sulfa Antibiotics Hives   Adhesive [Tape] Other (See Comments)    From band aids - dermatitis    Past Medical History:  Diagnosis Date   Abnormal Pap smear of cervix    68 yrs old, just repeat done   Allergy    Clotting disorder    Factor V Leiden    Hx of sessile serrated colonic polyps 04/27/2020   Multiple thyroid  nodules    Osteopenia 12/2007   PONV (postoperative nausea and vomiting)    Protein S deficiency    Thyroid  disease    hypothyroid    Past Surgical History:  Procedure Laterality Date   BREAST CYST ASPIRATION Right    BREAST EXCISIONAL BIOPSY Right 08/09/2020   fibroadenoma   BREAST LUMPECTOMY WITH RADIOACTIVE SEED LOCALIZATION Right 08/09/2020   Procedure: RIGHT BREAST LUMPECTOMY WITH RADIOACTIVE SEED LOCALIZATION;  Surgeon: Belinda Cough, MD;  Location: [facility];  Service: General;  Laterality: Right;   REFRACTIVE SURGERY Right 10/2013   Lasix   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 She has never used smokeless tobacco. She reports current alcohol use of about 4.0 standard drinks of alcohol per week. She reports that she does not use drugs.  Medications: Medications Prior to Admission  Medication Sig Dispense Refill   levothyroxine  (SYNTHROID ) 75 MCG tablet TAKE 1 TABLET IN THE MORNING BEFORE BREAKFAST. 30 tablet 12   spironolactone  (ALDACTONE) 25 MG tablet Take 25 mg by mouth daily.     cholecalciferol (VITAMIN D3) 25 MCG (1000 UNIT) tablet Take by mouth.     CINNAMON PO Take by mouth.     CRANBERRY PO Take by mouth.     Cyanocobalamin (VITAMIN B-12 PO) Take by mouth.     ergocalciferol  (VITAMIN D2) 1.25 MG (50000 UT) capsule Take 50,000 Units by mouth once a week.     fluticasone (FLONASE ALLERGY RELIEF) 50 MCG/ACT nasal spray      glucosamine-chondroitin 500-400 MG tablet Take 1 tablet by mouth 3 (three) times daily.     ibuprofen (ADVIL) 100 MG/5ML suspension Take 200 mg by mouth every 4 (four) hours as needed.     Multiple Vitamin (MULTIVITAMIN) tablet Take 1 tablet by mouth daily.     Probiotic Product (PRO-BIOTIC BLEND PO) Take by mouth.      No results found for this or any previous visit (from the past 48 hours).  No results found.    Blood pressure 135/76, pulse 87, temperature 98.2 F (36.8 C), resp. rate 12, height 5' 5 (1.651 m), weight 63 kg, last menstrual period 10/12/2003, SpO2 100%.  General appearance: alert, cooperative, and appears stated age Head: Normocephalic, without obvious abnormality, atraumatic Neck: supple, symmetrical, trachea midline Extremities: Intact sensation and capillary refill all  digits.  +epl/fpl/io.  No wounds.  Skin: Skin color, texture, turgor normal. 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  Non operative and operative treatment options have been discussed with the patient and patient wishes to proceed with operative treatment. Risks, benefits, and alternatives of surgery have been discussed and the patient agrees with the plan of care.   Brandy Ortiz 04/19/2024, 1:40 PM

## 2024-04-19 NOTE — Anesthesia Preprocedure Evaluation (Addendum)
 Anesthesia Evaluation  Patient identified by MRN, date of birth, ID band Patient awake    Reviewed: Allergy & Precautions, NPO status , Patient's Chart, lab work & pertinent test results  History of Anesthesia Complications (+) PONV and history of anesthetic complications  Airway Mallampati: I  TM Distance: >3 FB Neck ROM: Full    Dental  (+) Teeth Intact, Dental Advisory Given   Pulmonary neg pulmonary ROS   breath sounds clear to auscultation       Cardiovascular negative cardio ROS  Rhythm:Regular Rate:Normal     Neuro/Psych negative neurological ROS  negative psych ROS   GI/Hepatic negative GI ROS, Neg liver ROS,,,  Endo/Other  Hypothyroidism    Renal/GU negative Renal ROS     Musculoskeletal negative musculoskeletal ROS (+)    Abdominal   Peds  Hematology negative hematology ROS (+)   Anesthesia Other Findings   Reproductive/Obstetrics                              Anesthesia Physical Anesthesia Plan  ASA: 2  Anesthesia Plan: General   Post-op Pain Management: Tylenol  PO (pre-op)* and Toradol  IV (intra-op)*   Induction: Intravenous  PONV Risk Score and Plan: 4 or greater and Ondansetron , Dexamethasone , Midazolam , Scopolamine  patch - Pre-op, TIVA and Propofol  infusion  Airway Management Planned: LMA  Additional Equipment: None  Intra-op Plan:   Post-operative Plan: Extubation in OR  Informed Consent: I have reviewed the patients History and Physical, chart, labs and discussed the procedure including the risks, benefits and alternatives for the proposed anesthesia with the patient or authorized representative who has indicated his/her understanding and acceptance.     Dental advisory given  Plan Discussed with: CRNA  Anesthesia Plan Comments:          Anesthesia Quick Evaluation

## 2024-04-19 NOTE — Transfer of Care (Signed)
 Immediate Anesthesia Transfer of Care Note  Patient: Brandy Ortiz  Procedure(s) Performed: REMOVAL, CYST, HAND (Left: Middle Finger) ARTHROTOMY (Left: Middle Finger)  Patient Location: PACU  Anesthesia Type:General  Level of Consciousness: awake, alert , oriented, and patient cooperative  Airway & Oxygen Therapy: Patient connected to face mask oxygen  Post-op Assessment: Report given to RN and Post -op Vital signs reviewed and stable  Post vital signs: Reviewed and stable  Last Vitals:  Vitals Value Taken Time  BP 111/61 04/19/24 16:07  Temp 36.4 C 04/19/24 16:07  Pulse 81 04/19/24 16:11  Resp 16 04/19/24 16:11  SpO2 97 % 04/19/24 16:11  Vitals shown include unfiled device data.  Last Pain:  Vitals:   04/19/24 1607  PainSc: 0-No pain         Complications: No notable events documented.

## 2024-04-19 NOTE — Anesthesia Procedure Notes (Signed)
 Procedure Name: LMA Insertion Date/Time: 04/19/2024 3:21 PM  Performed by: Pam Macario BROCKS, CRNAPre-anesthesia Checklist: Patient identified, Emergency Drugs available, Suction available, Patient being monitored and Timeout performed Patient Re-evaluated:Patient Re-evaluated prior to induction Oxygen Delivery Method: Circle system utilized Preoxygenation: Pre-oxygenation with 100% oxygen Induction Type: IV induction Ventilation: Mask ventilation without difficulty LMA: LMA inserted LMA Size: 3.0 Number of attempts: 1 Airway Equipment and Method: Bite block Placement Confirmation: positive ETCO2, CO2 detector and breath sounds checked- equal and bilateral Tube secured with: Tape Dental Injury: Teeth and Oropharynx as per pre-operative assessment

## 2024-04-19 NOTE — H&P (Deleted)
 Brandy Ortiz is an 68 y.o. female.   Chief Complaint: thumb mass HPI: 68 yo female with left thumb mass x several months.  It is bothersome to him.  Previous lesion treated as a wart with resolution.  He wishes to proceed with surgical excision.  Allergies:  Allergies  Allergen Reactions   Sulfa Antibiotics Hives   Adhesive [Tape] Other (See Comments)    From band aids - dermatitis    Past Medical History:  Diagnosis Date   Abnormal Pap smear of cervix    68 yrs old, just repeat done   Allergy    Clotting disorder    Factor V Leiden    Hx of sessile serrated colonic polyps 04/27/2020   Multiple thyroid  nodules    Osteopenia 12/2007   PONV (postoperative nausea and vomiting)    Protein S deficiency    Thyroid  disease    hypothyroid    Past Surgical History:  Procedure Laterality Date   BREAST CYST ASPIRATION Right    BREAST EXCISIONAL BIOPSY Right 08/09/2020   fibroadenoma   BREAST LUMPECTOMY WITH RADIOACTIVE SEED LOCALIZATION Right 08/09/2020   Procedure: RIGHT BREAST LUMPECTOMY WITH RADIOACTIVE SEED LOCALIZATION;  Surgeon: Belinda Cough, MD;  Location: [facility];  Service: General;  Laterality: Right;   REFRACTIVE SURGERY Right 10/2013   Lasix   SKIN CANCER EXCISION Left 10/2012   Left clavicle - squamous cell   TONSILECTOMY, ADENOIDECTOMY, BILATERAL MYRINGOTOMY AND TUBES     at age 30    VAGINAL DELIVERY     x2    Family History: Family History  Adopted: Yes  Problem Relation Age of Onset   Heart disease Father    Stroke Father     Social History:   reports that she has never smoked. She has never used smokeless tobacco. She reports current alcohol use of about 4.0 standard drinks of alcohol per week. She reports that she does not use drugs.  Medications: Medications Prior to Admission  Medication Sig Dispense Refill   levothyroxine  (SYNTHROID ) 75 MCG tablet TAKE 1 TABLET IN THE MORNING BEFORE BREAKFAST. 30 tablet 12   spironolactone  (ALDACTONE) 25 MG tablet Take 25 mg by mouth daily.     cholecalciferol (VITAMIN D3) 25 MCG (1000 UNIT) tablet Take by mouth.     CINNAMON PO Take by mouth.     CRANBERRY PO Take by mouth.     Cyanocobalamin (VITAMIN B-12 PO) Take by mouth.     ergocalciferol  (VITAMIN D2) 1.25 MG (50000 UT) capsule Take 50,000 Units by mouth once a week.     fluticasone (FLONASE ALLERGY RELIEF) 50 MCG/ACT nasal spray      glucosamine-chondroitin 500-400 MG tablet Take 1 tablet by mouth 3 (three) times daily.     ibuprofen (ADVIL) 100 MG/5ML suspension Take 200 mg by mouth every 4 (four) hours as needed.     Multiple Vitamin (MULTIVITAMIN) tablet Take 1 tablet by mouth daily.     Probiotic Product (PRO-BIOTIC BLEND PO) Take by mouth.      No results found for this or any previous visit (from the past 48 hours).  No results found.    Blood pressure 135/76, pulse 87, temperature 98.2 F (36.8 C), resp. rate 12, height 5' 5 (1.651 m), weight 63 kg, last menstrual period 10/12/2003, SpO2 100%.  General appearance: alert, cooperative, and appears stated age Head: Normocephalic, without obvious abnormality, atraumatic Neck: supple, symmetrical, trachea midline Extremities: Intact sensation and capillary refill all digits.  +  epl/fpl/io.  No wounds. Mass on ulnar side left thumb. Skin: Skin color, texture, turgor normal. No rashes or lesions Neurologic: Grossly normal Incision/Wound: small scabbed over wound on thumb  Assessment/Plan Left thumb mass.  Non operative and operative treatment options have been discussed with the patient and patient wishes to proceed with operative treatment. Risks, benefits and alternatives of surgery were discussed including risks of blood loss, infection, damage to nerves/vessels/tendons/ligament/bone, failure of surgery, need for additional surgery, complication with wound healing, stiffness, recurrence.  She voiced understanding of these risks and elected to proceed.     Brandy Ortiz 04/19/2024, 1:36 PM

## 2024-04-19 NOTE — Discharge Instructions (Addendum)

## 2024-04-20 ENCOUNTER — Encounter (HOSPITAL_BASED_OUTPATIENT_CLINIC_OR_DEPARTMENT_OTHER): Payer: Self-pay | Admitting: Orthopedic Surgery

## 2024-04-20 NOTE — Anesthesia Postprocedure Evaluation (Signed)
 Anesthesia Post Note  Patient: Brandy Ortiz  Procedure(s) Performed: REMOVAL, CYST, HAND (Left: Middle Finger) ARTHROTOMY (Left: Middle Finger)     Patient location during evaluation: PACU Anesthesia Type: General Level of consciousness: awake and alert Pain management: pain level controlled Vital Signs Assessment: post-procedure vital signs reviewed and stable Respiratory status: spontaneous breathing, nonlabored ventilation, respiratory function stable and patient connected to nasal cannula oxygen Cardiovascular status: blood pressure returned to baseline and stable Postop Assessment: no apparent nausea or vomiting Anesthetic complications: no   No notable events documented.  Last Vitals:  Vitals:   04/19/24 1618 04/19/24 1647  BP: 112/63 (!) 106/95  Pulse: 83 70  Resp: 15 17  Temp:    SpO2: 99% 98%    Last Pain:  Vitals:   04/19/24 1645  PainSc: 0-No pain                 Franky JONETTA Bald

## 2024-04-21 LAB — SURGICAL PATHOLOGY
